# Patient Record
Sex: Male | Born: 1940 | Race: White | Hispanic: No | Marital: Single | Smoking: Current every day smoker
Health system: Southern US, Community
[De-identification: ages and names within clinical notes are randomized; demographics above are authoritative.]

## PROBLEM LIST (undated history)

## (undated) DIAGNOSIS — I2510 Atherosclerotic heart disease of native coronary artery without angina pectoris: Secondary | ICD-10-CM

## (undated) DIAGNOSIS — F909 Attention-deficit hyperactivity disorder, unspecified type: Secondary | ICD-10-CM

## (undated) HISTORY — PX: HERNIA REPAIR: SHX51

## (undated) HISTORY — PX: CARDIAC SURGERY: SHX584

---

## 2007-08-30 ENCOUNTER — Other Ambulatory Visit: Payer: Self-pay

## 2007-08-30 ENCOUNTER — Inpatient Hospital Stay: Payer: Self-pay | Admitting: Internal Medicine

## 2008-01-18 ENCOUNTER — Ambulatory Visit: Payer: Self-pay | Admitting: Internal Medicine

## 2017-09-09 ENCOUNTER — Encounter: Payer: Self-pay | Admitting: Emergency Medicine

## 2017-09-09 ENCOUNTER — Emergency Department: Payer: Medicare Other

## 2017-09-09 ENCOUNTER — Emergency Department
Admission: EM | Admit: 2017-09-09 | Discharge: 2017-09-09 | Disposition: A | Discharge status: Home / Self Care | Payer: Medicare Other | Attending: Emergency Medicine | Admitting: Emergency Medicine

## 2017-09-09 ENCOUNTER — Other Ambulatory Visit: Payer: Self-pay

## 2017-09-09 DIAGNOSIS — J209 Acute bronchitis, unspecified: Secondary | ICD-10-CM | POA: Diagnosis not present

## 2017-09-09 DIAGNOSIS — F17210 Nicotine dependence, cigarettes, uncomplicated: Secondary | ICD-10-CM | POA: Insufficient documentation

## 2017-09-09 DIAGNOSIS — R0602 Shortness of breath: Secondary | ICD-10-CM

## 2017-09-09 DIAGNOSIS — J40 Bronchitis, not specified as acute or chronic: Secondary | ICD-10-CM

## 2017-09-09 DIAGNOSIS — Z79899 Other long term (current) drug therapy: Secondary | ICD-10-CM | POA: Insufficient documentation

## 2017-09-09 DIAGNOSIS — E876 Hypokalemia: Secondary | ICD-10-CM | POA: Diagnosis not present

## 2017-09-09 HISTORY — DX: Attention-deficit hyperactivity disorder, unspecified type: F90.9

## 2017-09-09 LAB — CBC
HEMATOCRIT: 36.6 % — AB (ref 40.0–52.0)
Hemoglobin: 12.1 g/dL — ABNORMAL LOW (ref 13.0–18.0)
MCH: 29.1 pg (ref 26.0–34.0)
MCHC: 33.1 g/dL (ref 32.0–36.0)
MCV: 87.9 fL (ref 80.0–100.0)
Platelets: 169 10*3/uL (ref 150–440)
RBC: 4.16 MIL/uL — AB (ref 4.40–5.90)
RDW: 19.8 % — AB (ref 11.5–14.5)
WBC: 5.2 10*3/uL (ref 3.8–10.6)

## 2017-09-09 LAB — COMPREHENSIVE METABOLIC PANEL
ALBUMIN: 3.2 g/dL — AB (ref 3.5–5.0)
ALK PHOS: 83 U/L (ref 38–126)
ALT: 8 U/L — ABNORMAL LOW (ref 17–63)
AST: 21 U/L (ref 15–41)
Anion gap: 11 (ref 5–15)
BILIRUBIN TOTAL: 0.9 mg/dL (ref 0.3–1.2)
BUN: 16 mg/dL (ref 6–20)
CALCIUM: 8.9 mg/dL (ref 8.9–10.3)
CO2: 21 mmol/L — ABNORMAL LOW (ref 22–32)
Chloride: 104 mmol/L (ref 101–111)
Creatinine, Ser: 1.5 mg/dL — ABNORMAL HIGH (ref 0.61–1.24)
GFR calc Af Amer: 51 mL/min — ABNORMAL LOW (ref >60)
GFR, EST NON AFRICAN AMERICAN: 44 mL/min — AB (ref >60)
GLUCOSE: 108 mg/dL — AB (ref 65–99)
Potassium: 2.8 mmol/L — ABNORMAL LOW (ref 3.5–5.1)
Sodium: 136 mmol/L (ref 135–145)
TOTAL PROTEIN: 6.8 g/dL (ref 6.5–8.1)

## 2017-09-09 LAB — TROPONIN I
TROPONIN I: 0.03 ng/mL — AB (ref <0.03)
Troponin I: 0.03 ng/mL (ref <0.03)

## 2017-09-09 MED ORDER — PREDNISONE 20 MG PO TABS: 60.0000 mg | ORAL_TABLET | Freq: Every day | ORAL | 0 refills | Status: AC

## 2017-09-09 MED ORDER — POTASSIUM CHLORIDE 10 MEQ/100ML IV SOLN
10.0000 meq | Freq: Once | INTRAVENOUS | Status: AC
  Administered 2017-09-09: 10 meq via INTRAVENOUS
  Filled 2017-09-09: qty 100

## 2017-09-09 MED ORDER — ALBUTEROL SULFATE HFA 108 (90 BASE) MCG/ACT IN AERS: 2.0000 | INHALATION_SPRAY | Freq: Four times a day (QID) | RESPIRATORY_TRACT | 2 refills | Status: AC | PRN

## 2017-09-09 MED ORDER — SODIUM CHLORIDE 0.9 % IV BOLUS (SEPSIS)
1000.0000 mL | Freq: Once | INTRAVENOUS | Status: AC
  Administered 2017-09-09: 1000 mL via INTRAVENOUS

## 2017-09-09 MED ORDER — POTASSIUM CHLORIDE CRYS ER 20 MEQ PO TBCR
20.0000 meq | EXTENDED_RELEASE_TABLET | Freq: Once | ORAL | Status: AC
  Administered 2017-09-09: 20 meq via ORAL
  Filled 2017-09-09: qty 1

## 2017-09-09 MED ORDER — AZITHROMYCIN 250 MG PO TABS: ORAL_TABLET | ORAL | 0 refills | Status: AC

## 2017-09-09 MED ORDER — PREDNISONE 20 MG PO TABS
60.0000 mg | ORAL_TABLET | Freq: Once | ORAL | Status: AC
  Administered 2017-09-09: 60 mg via ORAL
  Filled 2017-09-09: qty 3

## 2017-09-09 MED ORDER — POTASSIUM CHLORIDE ER 20 MEQ PO TBCR: 20.0000 meq | EXTENDED_RELEASE_TABLET | Freq: Two times a day (BID) | ORAL | 0 refills | Status: AC

## 2017-09-09 MED ORDER — ALBUTEROL SULFATE (2.5 MG/3ML) 0.083% IN NEBU
2.5000 mg | INHALATION_SOLUTION | Freq: Once | RESPIRATORY_TRACT | Status: AC
  Administered 2017-09-09: 2.5 mg via RESPIRATORY_TRACT
  Filled 2017-09-09: qty 3

## 2017-09-09 NOTE — Discharge Instructions (Signed)
Take the potassium, the prednisone and the antibiotic as prescribed and finish the full course.  You may use the albuterol as needed.  Return to the ER immediately for new or worsening difficulty breathing, lightheadedness or weakness, chest pain, palpitations, fevers, or any other new or worsening symptoms that concern you.  You should make an appointment to follow-up with your primary care doctor as soon as possible.  You will likely need additional outpatient workup to further evaluate the cause of your shortness of breath, and to evaluate the weight loss.

## 2017-09-09 NOTE — ED Notes (Signed)
Date and time results received: 09/09/17 1744 (use smartphrase ".now" to insert current time)  Test: trop I Critical Value: 0.03  Name of Provider Notified: Lord  Orders Received? Or Actions Taken?: No ne orders at this time

## 2017-09-09 NOTE — ED Triage Notes (Signed)
States had been SOB x 2 months. States began coughing up "strawberry red" phlegm approx 1 month ago. States has lost about 40 pounds in past month and half.

## 2017-09-09 NOTE — ED Provider Notes (Addendum)
[facility] Emergency Department Provider Note ____________________________________________   First MD Initiated Contact with Patient 09/09/17 1739     (approximate)  I have reviewed the triage vital signs and the nursing notes.   HISTORY  Chief Complaint Shortness of Breath and Weight Loss    HPI Jimmy Hopkins is a 76 y.o. male with a past medical history of ADHD who presents with shortness of breath over the last 2 months, gradual onset, worsening course, worse with exertion, associated with cough that is sometimes productive of blood-tinged sputum, and associated with an approximately 40-50 pound unintentional weight loss over the last 2 months.  Patient denies any acute fever, he denies chest pain, leg swelling, lightheadedness, generalized weakness, or vomiting.  He does report chronic diarrhea over the last several weeks, with loose stools usually daily but sometimes more frequently.  Patient states that the reason he came in today is that he was joining his family for Thanksgiving lunch, and they became concerned and told him he needed to come to the hospital.  Past Medical History:  Diagnosis Date  . ADHD     There are no active problems to display for this patient.   History reviewed. No pertinent surgical history.  Prior to Admission medications   Medication Sig Start Date End Date Taking? Authorizing Provider  methylphenidate (RITALIN) 10 MG tablet Take 1 tablet by mouth 2 (two) times daily.   Yes [provider]  methylphenidate (RITALIN) 20 MG tablet Take 1 tablet by mouth 3 (three) times daily. 06/16/17  Yes [provider]    Allergies Tetracyclines & related  No family history on file.  Social History Social History   Tobacco Use  . Smoking status: Current Every Day Smoker    Packs/day: 1.00    Types: Cigarettes  Substance Use Topics  . Alcohol use: Not on file  . Drug use: Not on file    Review of  Systems  Constitutional: No fever/chills Eyes: No redness. ENT: No sore throat. Cardiovascular: Denies chest pain. Respiratory: Positive for shortness of breath. Gastrointestinal: No nausea, no vomiting.  Positive for diarrhea.  Genitourinary: Negative for dysuria.  Musculoskeletal: Negative for back pain. Skin: Negative for rash. Neurological: Negative for headaches, focal weakness or numbness.   ____________________________________________   PHYSICAL EXAM:  VITAL SIGNS: ED Triage Vitals  Enc Vitals Group     BP 09/09/17 1648 (!) 131/93     Pulse Rate 09/09/17 1648 (!) 101     Resp 09/09/17 1648 20     Temp 09/09/17 1648 97.6 F (36.4 C)     Temp Source 09/09/17 1648 Oral     SpO2 09/09/17 1648 100 %     Weight 09/09/17 1649 148 lb 2.4 oz (67.2 kg)     Height 09/09/17 1649 6' (1.829 m)     Head Circumference --      Peak Flow --      Pain Score --      Pain Loc --      Pain Edu? --      Excl. in GC? --     Constitutional: Alert and oriented.  Slightly uncomfortable appearing but in no acute distress. Eyes: Conjunctivae are normal.  Head: Atraumatic. Nose: No congestion/rhinnorhea. Mouth/Throat: Mucous membranes are dry.   Neck: Normal range of motion.  Cardiovascular: Normal rate, regular rhythm. Grossly normal heart sounds.  Good peripheral circulation. Respiratory: Normal respiratory effort.  No retractions.  Trace rales to bilateral bases,  but lungs otherwise clear. Gastrointestinal: Soft and nontender. No distention.  Genitourinary: No CVA tenderness. Musculoskeletal: No lower extremity edema.  Extremities warm and well perfused.  Neurologic:  Normal speech and language. No gross focal neurologic deficits are appreciated.  Skin:  Skin is warm and dry. No rash noted. Psychiatric: Mood and affect are normal. Speech and behavior are normal.  ____________________________________________   LABS (all labs ordered are listed, but only abnormal results are  displayed)  Labs Reviewed  CBC - Abnormal; Notable for the following components:      Result Value   RBC 4.16 (*)    Hemoglobin 12.1 (*)    HCT 36.6 (*)    RDW 19.8 (*)    All other components within normal limits  TROPONIN I - Abnormal; Notable for the following components:   Troponin I 0.03 (*)    All other components within normal limits  COMPREHENSIVE METABOLIC PANEL - Abnormal; Notable for the following components:   Potassium 2.8 (*)    CO2 21 (*)    Glucose, Bld 108 (*)    Creatinine, Ser 1.50 (*)    Albumin 3.2 (*)    ALT 8 (*)    GFR calc non Af Amer 44 (*)    GFR calc Af Amer 51 (*)    All other components within normal limits  TROPONIN I - Abnormal; Notable for the following components:   Troponin I 0.03 (*)    All other components within normal limits   ____________________________________________  EKG  ED ECG REPORT I, Dionne BucySebastian Major Santerre, the attending physician, personally viewed and interpreted this ECG.  Date: 09/09/2017 EKG Time: 1700 Rate: 101 Rhythm: normal sinus rhythm with occasional PVCs QRS Axis: normal Intervals: normal ST/T Wave abnormalities: Nonspecific T wave flattening laterally Narrative Interpretation: Nonspecific findings, but no evidence of acute ischemia; no recent prior EKG for comparison.  ____________________________________________  RADIOLOGY  CXR: L lower lobe opacity consistent with pna, bilateral effusions  CT chest: Bronchial thickening and bronchiectasis.  Bilateral pleural effusions.  Thoracic aortic aneurysm.  No focal infiltrate.  ____________________________________________   PROCEDURES  Procedure(s) performed: No    Critical Care performed: No ____________________________________________   INITIAL IMPRESSION / ASSESSMENT AND PLAN / ED COURSE  Pertinent labs & imaging results that were available during my care of the patient were reviewed by me and considered in my medical decision making (see chart for  details).  76 year old male with past medical history as noted above presents with worsening shortness of breath over the last 2 months, somewhat exertional, and associated with intermittently blood-tinged sputum.  Is also associated with an unintentional weight loss.  Review of past medical records in Epic is noncontributory.  Patient has not seen a doctor since the symptoms began.  On exam, patient is slightly uncomfortable but not acutely toxic appearing, his vital signs are normal except for borderline heart rate, his O2 sat is normal, and the remainder of the exam is significant for trace bilateral rales to the bases but otherwise no significant findings.  I do not have baseline labs on the patient, however initial workup revealed slight anemia, hypokalemia, slightly elevated creatinine, and minimally elevated indeterminate troponin.  Chest x-ray is consistent with a left lower lobe pneumonia.  The symptoms could very well be from a pneumonia versus bronchitis, however due to the chronic nature of the symptoms, the weight loss, and the blood-tinged sputum, my differential also includes malignancy.  I will obtain a CT chest to evaluate for mass  or other signs of malignancy.  If negative we will treat for pneumonia and have patient follow-up closely with his primary care.   ----------------------------------------- 7:20 PM on 09/09/2017 -----------------------------------------  CT confirms bilateral pleural effusion, but does not demonstrate any focal infiltrate, and there is no evidence of malignancy.  Patient has evidence of ocular thickening and bronchiectasis.  Patient states he is a smoker, and I suspect that this could be new onset COPD.  The CT also reveals thoracic aortic aneurysm, however patient states that he knew about this.  He has no chest pain, severe hypertension, or other evidence of dissection, rupture, or other acute complication.  We will await a repeat troponin, and give  nebulizer and steroid.  Given that patient has no hypoxia or respiratory distress, and the symptoms are chronic, there is no indication for admission to the hospital at this time.  We will treat for likely COPD with steroid and albuterol, and patient will be instructed to follow-up with his primary care doctor.  I will also discharge him with oral potassium, as he was found to be hypokalemic.  No EKG changes related to hypokalemia or indication for prolonged IV K therapy or admission.  Will give an IV dose here.   ----------------------------------------- 9:31 PM on 09/09/2017 -----------------------------------------  Patient states that symptoms have improved after the albuterol and prednisone.  Repeat troponin is unchanged.  He continues to have no chest pain.  He feels comfortable to go home.  I counseled patient and his family members extensively on the results of the workup, including the pulmonary findings, the hypokalemia, and the need for additional outpatient workup to determine the etiology of his respiratory problems, as well as to workup the weight loss.  I will discharge with prescription for albuterol and prednisone, azithromycin, as well as potassium repletion.  Return precautions given, and patient and family members expressed understanding.  He agrees to follow-up with his doctor within the next week.  ____________________________________________   FINAL CLINICAL IMPRESSION(S) / ED DIAGNOSES  Final diagnoses:  Shortness of breath  Bronchitis  Hypokalemia      NEW MEDICATIONS STARTED DURING THIS VISIT:  This SmartLink is deprecated. Use AVSMEDLIST instead to display the medication list for a patient.   Note:  This document was prepared using Dragon voice recognition software and may include unintentional dictation errors.    Dionne BucySiadecki, Izzah Pasqua, MD 09/09/17 2139    Dionne BucySiadecki, Braidan Ricciardi, MD 09/09/17 [phone]

## 2017-09-15 ENCOUNTER — Inpatient Hospital Stay (HOSPITAL_COMMUNITY)
Admit: 2017-09-15 | Discharge: 2017-09-15 | Disposition: A | Discharge status: Home / Self Care | Payer: Medicare Other | Attending: Internal Medicine | Admitting: Internal Medicine

## 2017-09-15 ENCOUNTER — Emergency Department: Payer: Medicare Other

## 2017-09-15 ENCOUNTER — Other Ambulatory Visit: Payer: Self-pay

## 2017-09-15 ENCOUNTER — Inpatient Hospital Stay
Admission: EM | Admit: 2017-09-15 | Discharge: 2017-10-19 | DRG: 871 | Disposition: E | Payer: Medicare Other | Attending: Specialist | Admitting: Specialist

## 2017-09-15 ENCOUNTER — Encounter: Payer: Self-pay | Admitting: Emergency Medicine

## 2017-09-15 DIAGNOSIS — I714 Abdominal aortic aneurysm, without rupture, unspecified: Secondary | ICD-10-CM

## 2017-09-15 DIAGNOSIS — I4891 Unspecified atrial fibrillation: Secondary | ICD-10-CM | POA: Diagnosis present

## 2017-09-15 DIAGNOSIS — R1010 Upper abdominal pain, unspecified: Secondary | ICD-10-CM | POA: Diagnosis not present

## 2017-09-15 DIAGNOSIS — R7989 Other specified abnormal findings of blood chemistry: Secondary | ICD-10-CM | POA: Diagnosis not present

## 2017-09-15 DIAGNOSIS — I712 Thoracic aortic aneurysm, without rupture, unspecified: Secondary | ICD-10-CM

## 2017-09-15 DIAGNOSIS — E861 Hypovolemia: Secondary | ICD-10-CM | POA: Diagnosis present

## 2017-09-15 DIAGNOSIS — A419 Sepsis, unspecified organism: Secondary | ICD-10-CM | POA: Diagnosis present

## 2017-09-15 DIAGNOSIS — G9341 Metabolic encephalopathy: Secondary | ICD-10-CM | POA: Diagnosis present

## 2017-09-15 DIAGNOSIS — E875 Hyperkalemia: Secondary | ICD-10-CM | POA: Diagnosis present

## 2017-09-15 DIAGNOSIS — K551 Chronic vascular disorders of intestine: Secondary | ICD-10-CM | POA: Diagnosis not present

## 2017-09-15 DIAGNOSIS — I5023 Acute on chronic systolic (congestive) heart failure: Secondary | ICD-10-CM | POA: Diagnosis present

## 2017-09-15 DIAGNOSIS — R1084 Generalized abdominal pain: Secondary | ICD-10-CM

## 2017-09-15 DIAGNOSIS — I480 Paroxysmal atrial fibrillation: Secondary | ICD-10-CM | POA: Diagnosis present

## 2017-09-15 DIAGNOSIS — N179 Acute kidney failure, unspecified: Secondary | ICD-10-CM

## 2017-09-15 DIAGNOSIS — N184 Chronic kidney disease, stage 4 (severe): Secondary | ICD-10-CM | POA: Diagnosis present

## 2017-09-15 DIAGNOSIS — R109 Unspecified abdominal pain: Secondary | ICD-10-CM

## 2017-09-15 DIAGNOSIS — I361 Nonrheumatic tricuspid (valve) insufficiency: Secondary | ICD-10-CM

## 2017-09-15 DIAGNOSIS — E43 Unspecified severe protein-calorie malnutrition: Secondary | ICD-10-CM | POA: Diagnosis present

## 2017-09-15 DIAGNOSIS — J9602 Acute respiratory failure with hypercapnia: Secondary | ICD-10-CM | POA: Diagnosis not present

## 2017-09-15 DIAGNOSIS — Z8249 Family history of ischemic heart disease and other diseases of the circulatory system: Secondary | ICD-10-CM

## 2017-09-15 DIAGNOSIS — E871 Hypo-osmolality and hyponatremia: Secondary | ICD-10-CM | POA: Diagnosis present

## 2017-09-15 DIAGNOSIS — I739 Peripheral vascular disease, unspecified: Secondary | ICD-10-CM | POA: Diagnosis not present

## 2017-09-15 DIAGNOSIS — K7200 Acute and subacute hepatic failure without coma: Secondary | ICD-10-CM | POA: Diagnosis present

## 2017-09-15 DIAGNOSIS — Z515 Encounter for palliative care: Secondary | ICD-10-CM | POA: Diagnosis not present

## 2017-09-15 DIAGNOSIS — I420 Dilated cardiomyopathy: Secondary | ICD-10-CM | POA: Diagnosis present

## 2017-09-15 DIAGNOSIS — R410 Disorientation, unspecified: Secondary | ICD-10-CM | POA: Diagnosis not present

## 2017-09-15 DIAGNOSIS — Z66 Do not resuscitate: Secondary | ICD-10-CM | POA: Diagnosis not present

## 2017-09-15 DIAGNOSIS — I2510 Atherosclerotic heart disease of native coronary artery without angina pectoris: Secondary | ICD-10-CM | POA: Diagnosis present

## 2017-09-15 DIAGNOSIS — I70213 Atherosclerosis of native arteries of extremities with intermittent claudication, bilateral legs: Secondary | ICD-10-CM | POA: Diagnosis present

## 2017-09-15 DIAGNOSIS — E872 Acidosis: Secondary | ICD-10-CM | POA: Diagnosis present

## 2017-09-15 DIAGNOSIS — I130 Hypertensive heart and chronic kidney disease with heart failure and stage 1 through stage 4 chronic kidney disease, or unspecified chronic kidney disease: Secondary | ICD-10-CM | POA: Diagnosis present

## 2017-09-15 DIAGNOSIS — D696 Thrombocytopenia, unspecified: Secondary | ICD-10-CM | POA: Diagnosis present

## 2017-09-15 DIAGNOSIS — J9601 Acute respiratory failure with hypoxia: Secondary | ICD-10-CM | POA: Diagnosis present

## 2017-09-15 DIAGNOSIS — E860 Dehydration: Secondary | ICD-10-CM | POA: Diagnosis present

## 2017-09-15 DIAGNOSIS — R627 Adult failure to thrive: Secondary | ICD-10-CM | POA: Diagnosis present

## 2017-09-15 DIAGNOSIS — Z6820 Body mass index (BMI) 20.0-20.9, adult: Secondary | ICD-10-CM

## 2017-09-15 DIAGNOSIS — N189 Chronic kidney disease, unspecified: Secondary | ICD-10-CM | POA: Diagnosis not present

## 2017-09-15 DIAGNOSIS — J9690 Respiratory failure, unspecified, unspecified whether with hypoxia or hypercapnia: Secondary | ICD-10-CM

## 2017-09-15 DIAGNOSIS — N170 Acute kidney failure with tubular necrosis: Secondary | ICD-10-CM | POA: Diagnosis present

## 2017-09-15 DIAGNOSIS — I248 Other forms of acute ischemic heart disease: Secondary | ICD-10-CM | POA: Diagnosis present

## 2017-09-15 DIAGNOSIS — F909 Attention-deficit hyperactivity disorder, unspecified type: Secondary | ICD-10-CM | POA: Diagnosis present

## 2017-09-15 DIAGNOSIS — D649 Anemia, unspecified: Secondary | ICD-10-CM | POA: Diagnosis present

## 2017-09-15 DIAGNOSIS — G8929 Other chronic pain: Secondary | ICD-10-CM | POA: Diagnosis present

## 2017-09-15 DIAGNOSIS — R6521 Severe sepsis with septic shock: Secondary | ICD-10-CM | POA: Diagnosis present

## 2017-09-15 DIAGNOSIS — F17210 Nicotine dependence, cigarettes, uncomplicated: Secondary | ICD-10-CM | POA: Diagnosis present

## 2017-09-15 DIAGNOSIS — Z951 Presence of aortocoronary bypass graft: Secondary | ICD-10-CM | POA: Diagnosis not present

## 2017-09-15 HISTORY — DX: Atherosclerotic heart disease of native coronary artery without angina pectoris: I25.10

## 2017-09-15 LAB — COMPREHENSIVE METABOLIC PANEL
ALBUMIN: 3.6 g/dL (ref 3.5–5.0)
ALBUMIN: 3.1 g/dL — AB (ref 3.5–5.0)
ALT: 1777 U/L — ABNORMAL HIGH (ref 17–63)
ALT: 1454 U/L — ABNORMAL HIGH (ref 17–63)
ANION GAP: 13 (ref 5–15)
AST: 2085 U/L — ABNORMAL HIGH (ref 15–41)
AST: 1460 U/L — ABNORMAL HIGH (ref 15–41)
Alkaline Phosphatase: 140 U/L — ABNORMAL HIGH (ref 38–126)
Alkaline Phosphatase: 107 U/L (ref 38–126)
Anion gap: 14 (ref 5–15)
BILIRUBIN TOTAL: 3.4 mg/dL — AB (ref 0.3–1.2)
BILIRUBIN TOTAL: 3 mg/dL — AB (ref 0.3–1.2)
BUN: 62 mg/dL — ABNORMAL HIGH (ref 6–20)
BUN: 56 mg/dL — ABNORMAL HIGH (ref 6–20)
CHLORIDE: 99 mmol/L — AB (ref 101–111)
CO2: 16 mmol/L — AB (ref 22–32)
CO2: 16 mmol/L — ABNORMAL LOW (ref 22–32)
Calcium: 9.3 mg/dL (ref 8.9–10.3)
Calcium: 8.4 mg/dL — ABNORMAL LOW (ref 8.9–10.3)
Chloride: 101 mmol/L (ref 101–111)
Creatinine, Ser: 2.89 mg/dL — ABNORMAL HIGH (ref 0.61–1.24)
Creatinine, Ser: 2.66 mg/dL — ABNORMAL HIGH (ref 0.61–1.24)
GFR calc Af Amer: 23 mL/min — ABNORMAL LOW (ref >60)
GFR calc non Af Amer: 20 mL/min — ABNORMAL LOW (ref >60)
GFR calc non Af Amer: 22 mL/min — ABNORMAL LOW (ref >60)
GFR, EST AFRICAN AMERICAN: 25 mL/min — AB (ref >60)
GLUCOSE: 58 mg/dL — AB (ref 65–99)
GLUCOSE: 94 mg/dL (ref 65–99)
POTASSIUM: 6.9 mmol/L — AB (ref 3.5–5.1)
POTASSIUM: 5.5 mmol/L — AB (ref 3.5–5.1)
SODIUM: 128 mmol/L — AB (ref 135–145)
Sodium: 131 mmol/L — ABNORMAL LOW (ref 135–145)
TOTAL PROTEIN: 6.5 g/dL (ref 6.5–8.1)
TOTAL PROTEIN: 5.5 g/dL — AB (ref 6.5–8.1)

## 2017-09-15 LAB — BRAIN NATRIURETIC PEPTIDE

## 2017-09-15 LAB — BASIC METABOLIC PANEL
ANION GAP: 9 (ref 5–15)
Anion gap: 11 (ref 5–15)
BUN: 60 mg/dL — AB (ref 6–20)
BUN: 57 mg/dL — ABNORMAL HIGH (ref 6–20)
CALCIUM: 8.3 mg/dL — AB (ref 8.9–10.3)
CALCIUM: 8.1 mg/dL — AB (ref 8.9–10.3)
CO2: 18 mmol/L — AB (ref 22–32)
CO2: 20 mmol/L — AB (ref 22–32)
CREATININE: 2.81 mg/dL — AB (ref 0.61–1.24)
Chloride: 100 mmol/L — ABNORMAL LOW (ref 101–111)
Chloride: 101 mmol/L (ref 101–111)
Creatinine, Ser: 2.69 mg/dL — ABNORMAL HIGH (ref 0.61–1.24)
GFR calc Af Amer: 25 mL/min — ABNORMAL LOW (ref >60)
GFR calc non Af Amer: 21 mL/min — ABNORMAL LOW (ref >60)
GFR, EST AFRICAN AMERICAN: 24 mL/min — AB (ref >60)
GFR, EST NON AFRICAN AMERICAN: 22 mL/min — AB (ref >60)
Glucose, Bld: 108 mg/dL — ABNORMAL HIGH (ref 65–99)
Glucose, Bld: 118 mg/dL — ABNORMAL HIGH (ref 65–99)
Potassium: 5.3 mmol/L — ABNORMAL HIGH (ref 3.5–5.1)
Potassium: 4.9 mmol/L (ref 3.5–5.1)
SODIUM: 129 mmol/L — AB (ref 135–145)
Sodium: 130 mmol/L — ABNORMAL LOW (ref 135–145)

## 2017-09-15 LAB — URINALYSIS, ROUTINE W REFLEX MICROSCOPIC
BILIRUBIN URINE: NEGATIVE
Bacteria, UA: NONE SEEN
Glucose, UA: NEGATIVE mg/dL
Ketones, ur: NEGATIVE mg/dL
Leukocytes, UA: NEGATIVE
Nitrite: NEGATIVE
PH: 5 (ref 5.0–8.0)
Protein, ur: NEGATIVE mg/dL
SPECIFIC GRAVITY, URINE: 1.01 (ref 1.005–1.030)
SQUAMOUS EPITHELIAL / LPF: NONE SEEN

## 2017-09-15 LAB — URINALYSIS, COMPLETE (UACMP) WITH MICROSCOPIC
BACTERIA UA: NONE SEEN
Bilirubin Urine: NEGATIVE
GLUCOSE, UA: NEGATIVE mg/dL
HGB URINE DIPSTICK: NEGATIVE
KETONES UR: NEGATIVE mg/dL
LEUKOCYTES UA: NEGATIVE
NITRITE: NEGATIVE
PROTEIN: NEGATIVE mg/dL
Specific Gravity, Urine: 1.014 (ref 1.005–1.030)
pH: 5 (ref 5.0–8.0)

## 2017-09-15 LAB — CBC
HEMATOCRIT: 34.7 % — AB (ref 40.0–52.0)
HEMOGLOBIN: 11.1 g/dL — AB (ref 13.0–18.0)
MCH: 28.6 pg (ref 26.0–34.0)
MCHC: 32.1 g/dL (ref 32.0–36.0)
MCV: 89.2 fL (ref 80.0–100.0)
Platelets: 65 10*3/uL — ABNORMAL LOW (ref 150–440)
RBC: 3.89 MIL/uL — ABNORMAL LOW (ref 4.40–5.90)
RDW: 19.6 % — ABNORMAL HIGH (ref 11.5–14.5)
WBC: 11.6 10*3/uL — ABNORMAL HIGH (ref 3.8–10.6)

## 2017-09-15 LAB — CORTISOL: CORTISOL PLASMA: 36.1 ug/dL

## 2017-09-15 LAB — TROPONIN I
TROPONIN I: 0.23 ng/mL — AB (ref <0.03)
Troponin I: 0.24 ng/mL (ref <0.03)

## 2017-09-15 LAB — ECHOCARDIOGRAM COMPLETE
Height: 72 in
Weight: 2400 oz

## 2017-09-15 LAB — TYPE AND SCREEN
ABO/RH(D): A POS
Antibody Screen: NEGATIVE

## 2017-09-15 LAB — GLUCOSE, CAPILLARY
GLUCOSE-CAPILLARY: 104 mg/dL — AB (ref 65–99)
GLUCOSE-CAPILLARY: 120 mg/dL — AB (ref 65–99)
GLUCOSE-CAPILLARY: 121 mg/dL — AB (ref 65–99)
Glucose-Capillary: 47 mg/dL — ABNORMAL LOW (ref 65–99)
Glucose-Capillary: 56 mg/dL — ABNORMAL LOW (ref 65–99)

## 2017-09-15 LAB — PROCALCITONIN: PROCALCITONIN: 1.04 ng/mL

## 2017-09-15 LAB — MRSA PCR SCREENING: MRSA BY PCR: NEGATIVE

## 2017-09-15 LAB — MAGNESIUM: Magnesium: 2.3 mg/dL (ref 1.7–2.4)

## 2017-09-15 LAB — PROTIME-INR
INR: 3.49
PROTHROMBIN TIME: 34.8 s — AB (ref 11.4–15.2)

## 2017-09-15 LAB — APTT: aPTT: 42 seconds — ABNORMAL HIGH (ref 24–36)

## 2017-09-15 LAB — LACTIC ACID, PLASMA
LACTIC ACID, VENOUS: 3.6 mmol/L — AB (ref 0.5–1.9)
LACTIC ACID, VENOUS: 3.2 mmol/L — AB (ref 0.5–1.9)
Lactic Acid, Venous: 5.5 mmol/L (ref 0.5–1.9)

## 2017-09-15 LAB — LIPASE, BLOOD: LIPASE: 52 U/L — AB (ref 11–51)

## 2017-09-15 MED ORDER — FUROSEMIDE 10 MG/ML IJ SOLN
40.0000 mg | Freq: Once | INTRAMUSCULAR | Status: AC
  Administered 2017-09-15: 40 mg via INTRAVENOUS
  Filled 2017-09-15: qty 4

## 2017-09-15 MED ORDER — HEPARIN SODIUM (PORCINE) 5000 UNIT/ML IJ SOLN: 5000.0000 [IU] | Freq: Three times a day (TID) | INTRAMUSCULAR | Status: DC

## 2017-09-15 MED ORDER — SODIUM CHLORIDE 0.9 % IV SOLN
1.0000 g | Freq: Once | INTRAVENOUS | Status: AC
  Administered 2017-09-15: 1 g via INTRAVENOUS
  Filled 2017-09-15: qty 10

## 2017-09-15 MED ORDER — NOREPINEPHRINE BITARTRATE 1 MG/ML IV SOLN
5.0000 ug/min | INTRAVENOUS | Status: DC
  Filled 2017-09-15: qty 4

## 2017-09-15 MED ORDER — PIPERACILLIN-TAZOBACTAM 3.375 G IVPB 30 MIN
3.3750 g | Freq: Once | INTRAVENOUS | Status: DC
  Filled 2017-09-15: qty 50

## 2017-09-15 MED ORDER — SODIUM BICARBONATE 8.4 % IV SOLN
INTRAVENOUS | Status: DC
  Administered 2017-09-15 – 2017-09-16 (×2): via INTRAVENOUS
  Filled 2017-09-15 (×2): qty 150

## 2017-09-15 MED ORDER — DOCUSATE SODIUM 100 MG PO CAPS: 100.0000 mg | ORAL_CAPSULE | Freq: Two times a day (BID) | ORAL | Status: DC

## 2017-09-15 MED ORDER — ONDANSETRON HCL 4 MG PO TABS: 4.0000 mg | ORAL_TABLET | Freq: Four times a day (QID) | ORAL | Status: DC | PRN

## 2017-09-15 MED ORDER — SODIUM CHLORIDE 0.9 % IV BOLUS (SEPSIS): 1000.0000 mL | Freq: Once | INTRAVENOUS | Status: DC

## 2017-09-15 MED ORDER — ACETAMINOPHEN 650 MG RE SUPP: 650.0000 mg | Freq: Four times a day (QID) | RECTAL | Status: DC | PRN

## 2017-09-15 MED ORDER — FENTANYL CITRATE (PF) 100 MCG/2ML IJ SOLN
50.0000 ug | Freq: Once | INTRAMUSCULAR | Status: AC
  Administered 2017-09-15: 50 ug via INTRAVENOUS

## 2017-09-15 MED ORDER — VANCOMYCIN HCL IN DEXTROSE 750-5 MG/150ML-% IV SOLN: 750.0000 mg | INTRAVENOUS | Status: DC

## 2017-09-15 MED ORDER — BISACODYL 5 MG PO TBEC: 5.0000 mg | DELAYED_RELEASE_TABLET | Freq: Every day | ORAL | Status: DC | PRN

## 2017-09-15 MED ORDER — VANCOMYCIN HCL IN DEXTROSE 1-5 GM/200ML-% IV SOLN: 1000.0000 mg | Freq: Once | INTRAVENOUS | Status: DC

## 2017-09-15 MED ORDER — IOPAMIDOL (ISOVUE-370) INJECTION 76%
100.0000 mL | Freq: Once | INTRAVENOUS | Status: AC | PRN
  Administered 2017-09-15: 100 mL via INTRAVENOUS

## 2017-09-15 MED ORDER — DEXMEDETOMIDINE HCL IN NACL 400 MCG/100ML IV SOLN
0.0000 ug/kg/h | INTRAVENOUS | Status: DC
  Administered 2017-09-15: 0.6 ug/kg/h via INTRAVENOUS
  Administered 2017-09-15: 0.8 ug/kg/h via INTRAVENOUS
  Administered 2017-09-16: 0.6 ug/kg/h via INTRAVENOUS
  Filled 2017-09-15 (×3): qty 100

## 2017-09-15 MED ORDER — MORPHINE SULFATE (PF) 2 MG/ML IV SOLN
INTRAVENOUS | Status: AC
  Administered 2017-09-15: 2 mg via INTRAVENOUS
  Filled 2017-09-15: qty 1

## 2017-09-15 MED ORDER — ACETAMINOPHEN 325 MG PO TABS: 650.0000 mg | ORAL_TABLET | Freq: Four times a day (QID) | ORAL | Status: DC | PRN

## 2017-09-15 MED ORDER — PIPERACILLIN-TAZOBACTAM 3.375 G IVPB
3.3750 g | Freq: Two times a day (BID) | INTRAVENOUS | Status: DC
  Administered 2017-09-15 – 2017-09-16 (×2): 3.375 g via INTRAVENOUS
  Filled 2017-09-15 (×2): qty 50

## 2017-09-15 MED ORDER — SODIUM CHLORIDE 0.9 % IV SOLN
Freq: Once | INTRAVENOUS | Status: AC
  Administered 2017-09-15: 09:00:00 via INTRAVENOUS

## 2017-09-15 MED ORDER — FENTANYL CITRATE (PF) 100 MCG/2ML IJ SOLN
INTRAMUSCULAR | Status: AC
  Administered 2017-09-15: 50 ug via INTRAVENOUS
  Filled 2017-09-15: qty 2

## 2017-09-15 MED ORDER — MAGNESIUM SULFATE 2 GM/50ML IV SOLN
INTRAVENOUS | Status: AC
  Administered 2017-09-15: 2 g via INTRAVENOUS
  Filled 2017-09-15: qty 50

## 2017-09-15 MED ORDER — DEXTROSE 50 % IV SOLN
1.0000 | Freq: Once | INTRAVENOUS | Status: AC
Start: 2017-09-15 — End: 2017-09-15
  Administered 2017-09-15: 50 mL via INTRAVENOUS
  Filled 2017-09-15: qty 50

## 2017-09-15 MED ORDER — FENTANYL CITRATE (PF) 100 MCG/2ML IJ SOLN
12.5000 ug | INTRAMUSCULAR | Status: DC | PRN
  Administered 2017-09-16 – 2017-09-17 (×3): 25 ug via INTRAVENOUS
  Filled 2017-09-15 (×3): qty 2

## 2017-09-15 MED ORDER — PIPERACILLIN-TAZOBACTAM 3.375 G IVPB: 3.3750 g | Freq: Two times a day (BID) | INTRAVENOUS | Status: DC

## 2017-09-15 MED ORDER — ONDANSETRON HCL 4 MG/2ML IJ SOLN: 4.0000 mg | Freq: Four times a day (QID) | INTRAMUSCULAR | Status: DC | PRN

## 2017-09-15 MED ORDER — CALCIUM GLUCONATE 10 % IV SOLN
INTRAVENOUS | Status: AC
  Filled 2017-09-15: qty 10

## 2017-09-15 MED ORDER — PIPERACILLIN-TAZOBACTAM 3.375 G IVPB: 3.3750 g | Freq: Three times a day (TID) | INTRAVENOUS | Status: DC

## 2017-09-15 MED ORDER — MAGNESIUM SULFATE 2 GM/50ML IV SOLN
2.0000 g | Freq: Once | INTRAVENOUS | Status: AC
  Administered 2017-09-15: 2 g via INTRAVENOUS

## 2017-09-15 MED ORDER — SODIUM CHLORIDE 0.9 % IV BOLUS (SEPSIS): 250.0000 mL | Freq: Once | INTRAVENOUS | Status: DC

## 2017-09-15 MED ORDER — MORPHINE SULFATE (PF) 2 MG/ML IV SOLN
1.0000 mg | INTRAVENOUS | Status: DC | PRN
  Administered 2017-09-15: 2 mg via INTRAVENOUS

## 2017-09-15 MED ORDER — VANCOMYCIN HCL IN DEXTROSE 1-5 GM/200ML-% IV SOLN
1000.0000 mg | Freq: Once | INTRAVENOUS | Status: AC
  Administered 2017-09-15: 1000 mg via INTRAVENOUS
  Filled 2017-09-15: qty 200

## 2017-09-15 MED ORDER — ALBUTEROL SULFATE (2.5 MG/3ML) 0.083% IN NEBU
2.5000 mg | INHALATION_SOLUTION | Freq: Once | RESPIRATORY_TRACT | Status: AC
  Administered 2017-09-15: 2.5 mg via RESPIRATORY_TRACT
  Filled 2017-09-15: qty 3

## 2017-09-15 MED ORDER — SODIUM BICARBONATE 8.4 % IV SOLN
50.0000 meq | Freq: Once | INTRAVENOUS | Status: AC
  Administered 2017-09-15: 50 meq via INTRAVENOUS
  Filled 2017-09-15: qty 50

## 2017-09-15 MED ORDER — DEXTROSE 50 % IV SOLN
INTRAVENOUS | Status: AC
  Administered 2017-09-15: 50 mL via INTRAVENOUS
  Filled 2017-09-15: qty 50

## 2017-09-15 MED ORDER — FUROSEMIDE 10 MG/ML IJ SOLN
INTRAMUSCULAR | Status: AC
  Filled 2017-09-15: qty 4

## 2017-09-15 MED ORDER — INSULIN ASPART 100 UNIT/ML ~~LOC~~ SOLN
10.0000 [IU] | Freq: Once | SUBCUTANEOUS | Status: AC
  Administered 2017-09-15: 10 [IU] via INTRAVENOUS
  Filled 2017-09-15: qty 1

## 2017-09-15 MED ORDER — DEXTROSE 50 % IV SOLN
1.0000 | Freq: Once | INTRAVENOUS | Status: AC
  Administered 2017-09-15: 50 mL via INTRAVENOUS

## 2017-09-15 MED ORDER — FENTANYL CITRATE (PF) 100 MCG/2ML IJ SOLN
25.0000 ug | INTRAMUSCULAR | Status: AC
  Administered 2017-09-15: 25 ug via INTRAVENOUS
  Filled 2017-09-15: qty 2

## 2017-09-15 MED ORDER — PIPERACILLIN-TAZOBACTAM 3.375 G IVPB 30 MIN
3.3750 g | Freq: Once | INTRAVENOUS | Status: AC
  Administered 2017-09-15: 3.375 g via INTRAVENOUS
  Filled 2017-09-15: qty 50

## 2017-09-15 MED ORDER — FUROSEMIDE 10 MG/ML IJ SOLN
80.0000 mg | INTRAMUSCULAR | Status: AC
  Administered 2017-09-15: 80 mg via INTRAVENOUS
  Filled 2017-09-15: qty 8

## 2017-09-15 NOTE — Progress Notes (Addendum)
Pharmacy Antibiotic Note  Jimmy Hopkins is a 76 y.o. male admitted on 08/20/2017 with sepsis of unknown source. Pharmacy has been consulted for Vancomycin and Zosyn dosing.  Patient received one dose of vancomycin 100mg  and Zosyn in the ED.   Plan: Due to potential for worsening SCr, will initiate  Zosyn 3.375g Q12H instead of Q8H.   Will hold off on ordering any scheduled vancomycin doses due to concern for accumulation as patient's Scr is increased. Patient has baseline Scr of 1.5 (11/22) and today AM Scr of 2.89. Additionally, patient received one dose of contrast 11/28 AM (after labs were drawn). Will draw a random 24 hour vancomycin level 11/29 AM to dose off of this level and patient's Scr. Pharmacy will continue to follow and adjust as needed.     ke = 0.022 T1/2 = 31 hr Vd = 47.6   Height: 6' (182.9 cm) Weight: 150 lb (68 kg) IBW/kg (Calculated) : 77.6  Temp (24hrs), Avg:97.5 F (36.4 C), Min:97.5 F (36.4 C), Max:97.5 F (36.4 C)  Recent Labs  Lab 09/09/17 1654 08/20/2017 0555 09/01/2017 0604  WBC 5.2 11.6*  --   CREATININE 1.50* 2.89*  --   LATICACIDVEN  --   --  5.5*    Estimated Creatinine Clearance: 21.2 mL/min (A) (by C-G formula based on SCr of 2.89 mg/dL (H)).    Allergies  Allergen Reactions  . Tetracyclines & Related Hives    Antimicrobials this admission: Vancomycin 11/28 >>  Zosyn 11/28 >>   Dose adjustments this admission:   Microbiology results: BCx: 11/28 >> sent  Thank you for allowing pharmacy to be a part of this patient's care.  Yolanda BonineHannah Rhaelyn Hopkins, PharmD Pharmacy Resident 08/29/2017 10:00 AM

## 2017-09-15 NOTE — Consult Note (Signed)
[facility] Vascular Consult Note  MRN : [id number]  Jimmy CoronaHarry L Hopkins is a 76 y.o. (Mar 06, 1941) male who presents with chief complaint of  Chief Complaint  Patient presents with  . Abdominal Pain  .  History of Present Illness: I am asked to evaluate the patient by Dr. Zenda AlpersWebster.  The patient is a 76 year old gentleman who apparently is quite nave to medical care and presented earlier this morning to the [facility] emergency department with the complaint of abdominal pain.  CT scan was obtained as the patient had a pulsatile mass on abdominal exam.  CT angios did confirm abdominal aortic aneurysm but it is only 4.5 cm in diameter and does not have any indication of rupture.  CT also noted significant femoral occlusive disease with extensive collaterals suggesting a very chronic nature.  While being evaluated in the emergency room he began complaining of his toes hurting and on reevaluation they appear to be somewhat dusky in nature.  This prompted a request for vascular evaluation.  Of note, the patient has multiple other findings both on his laboratory values as well as the CT scan all of which suggest a profound cardiopulmonary hepatic and renal dysfunction.   Current Facility-Administered Medications  Medication Dose Route Frequency Provider Last Rate Last Dose  . calcium gluconate 10 % injection            Current Outpatient Medications  Medication Sig Dispense Refill  . albuterol (PROVENTIL HFA;VENTOLIN HFA) 108 (90 Base) MCG/ACT inhaler Inhale 2 puffs into the lungs every 6 (six) hours as needed for wheezing or shortness of breath. 1 Inhaler 2  . methylphenidate (RITALIN) 10 MG tablet Take 1 tablet by mouth 2 (two) times daily.    . methylphenidate (RITALIN) 20 MG tablet Take 1 tablet by mouth 3 (three) times daily.    . potassium chloride 20 MEQ TBCR Take 20 mEq by mouth 2 (two) times daily for 14 days. (Patient not taking: Reported on  08/19/2017) 28 tablet 0  . predniSONE (DELTASONE) 20 MG tablet Take 3 tablets (60 mg total) by mouth daily for 5 days. (Patient not taking: Reported on 09/14/2017) 15 tablet 0    Past Medical History:  Diagnosis Date  . ADHD     Past Surgical History:  Procedure Laterality Date  . CARDIAC SURGERY    . HERNIA REPAIR      Social History Social History   Tobacco Use  . Smoking status: Current Every Day Smoker    Packs/day: 1.00    Types: Cigarettes  Substance Use Topics  . Alcohol use: Not on file  . Drug use: Not on file    Family History No family history on file.  Per the family members present no family history of bleeding/clotting disorders, porphyria or autoimmune disease   Allergies  Allergen Reactions  . Tetracyclines & Related Hives     REVIEW OF SYSTEMS (Negative unless checked)  Constitutional: [] Weight loss  [] Fever  [] Chills Cardiac: [] Chest pain   [] Chest pressure   [] Palpitations   [] Shortness of breath when laying flat   [] Shortness of breath at rest   [] Shortness of breath with exertion. Vascular:  [] Pain in legs with walking   [] Pain in legs at rest   [] Pain in legs when laying flat   [] Claudication   [] Pain in feet when walking  [] Pain in feet at rest  [] Pain in feet when laying flat   [] History of DVT   [] Phlebitis   []   Swelling in legs   [] Varicose veins   [] Non-healing ulcers Pulmonary:   [] Uses home oxygen   [] Productive cough   [] Hemoptysis   [] Wheeze  [] COPD   [] Asthma Neurologic:  [] Dizziness  [] Blackouts   [] Seizures   [] History of stroke   [] History of TIA  [] Aphasia   [] Temporary blindness   [] Dysphagia   [] Weakness or numbness in arms   [] Weakness or numbness in legs Musculoskeletal:  [] Arthritis   [] Joint swelling   [] Joint pain   [] Low back pain Hematologic:  [] Easy bruising  [] Easy bleeding   [] Hypercoagulable state   [] Anemic  [] Hepatitis Gastrointestinal:  [] Blood in stool   [] Vomiting blood  [] Gastroesophageal reflux/heartburn    [] Difficulty swallowing. Genitourinary:  [] Chronic kidney disease   [] Difficult urination  [] Frequent urination  [] Burning with urination   [] Blood in urine Skin:  [] Rashes   [] Ulcers   [] Wounds Psychological:  [] History of anxiety   []  History of major depression.  Physical Examination  Vitals:   2017-10-09 0745 10-09-2017 0754 2017-10-09 0809 2017-10-09 0833  BP: 138/87 138/78 (!) 130/92 120/86  Pulse: 78 87 84 91  Resp: (!) 24 15 (!) 25 15  Temp:      TempSrc:      SpO2: 99% 100% 100% 100%  Weight:      Height:       Body mass index is 20.34 kg/m. Gen:  WD/WN, confused and combative moderate to severe distress Head: [place]/AT, No temporalis wasting. Prominent temp pulse not noted. Ear/Nose/Throat: Hearing grossly intact, nares w/o erythema or drainage, oropharynx w/o Erythema/Exudate Eyes: Sclera non-icteric, conjunctiva clear Neck: Trachea midline.  + JVD.  Pulmonary: Poor air movement, respirations labored, equal bilaterally.  Cardiac: RRR, normal S1, S2. Vascular: Pulsatile abdominal mass non-tender Vessel Right Left  Radial Palpable Palpable  Aorta  enlarged N/A  Femoral  not palpable Palpable  Popliteal  not palpable  not palpable  PT  not palpable  not palpable  DP  not palpable  not palpable  Gastrointestinal: soft, mildly tender/non-distended. No guarding/reflex.  Musculoskeletal: M/S 5/5 throughout.  Extremities without ischemic changes.  No deformity or atrophy. No edema. Neurologic: Sensation grossly intact in extremities.  Symmetrical.  Speech is confused. Motor exam as listed above. Psychiatric: Judgment poor, Mood & affect appropriate for pt's clinical situation. Dermatologic: No rashes or ulcers noted.  No cellulitis or open wounds. Lymph : No Cervical, Axillary, or Inguinal lymphadenopathy.      CBC Lab Results  Component Value Date   WBC 11.6 (H) 10/09/2017   HGB 11.1 (L) 10/09/17   HCT 34.7 (L) October 09, 2017   MCV 89.2 2017-10-09   PLT 65 (L) October 09, 2017     BMET    Component Value Date/Time   NA 128 (L) October 09, 2017 0555   K 6.9 (HH) 2017/10/09 0555   CL 99 (L) Oct 09, 2017 0555   CO2 16 (L) October 09, 2017 0555   GLUCOSE 58 (L) 2017-10-09 0555   BUN 62 (H) October 09, 2017 0555   CREATININE 2.89 (H) 09-Oct-2017 0555   CALCIUM 9.3 10/09/17 0555   GFRNONAA 20 (L) 2017-10-09 0555   GFRAA 23 (L) October 09, 2017 0555   Estimated Creatinine Clearance: 21.2 mL/min (A) (by C-G formula based on SCr of 2.89 mg/dL (H)).  COAG No results found for: INR, PROTIME  Radiology Dg Chest 2 View  Result Date: 09/09/2017 CLINICAL DATA:  States had been SOB x 2 months. States began coughing up "strawberry red" phlegm approx 1 month ago. States has lost about 40 pounds in  past month and half. EXAM: CHEST  2 VIEW COMPARISON:  08/30/2007 FINDINGS: There is patchy airspace opacity at the left lung base mostly silhouetting the left hemidiaphragm, new since the prior exam. Lungs show prominent bronchovascular markings but are otherwise clear. Small pleural effusions. No pneumothorax. There stable changes from prior CABG surgery. No mediastinal or hilar masses or evidence of adenopathy. Skeletal structures are intact. IMPRESSION: 1. Left lung base opacity consistent with pneumonia. 2. Small pleural effusions.  No evidence of pulmonary edema. Electronically Signed   By: Amie Portlandavid  Ormond M.D.   On: 09/09/2017 17:22   Ct Chest Wo Contrast  Result Date: 09/09/2017 CLINICAL DATA:  Dyspnea x2 months with cough and reddish phlegm approximately 1 month ago. Loss of 40 pounds in the past month and a half. EXAM: CT CHEST WITHOUT CONTRAST TECHNIQUE: Multidetector CT imaging of the chest was performed following the standard protocol without IV contrast. COMPARISON:  Same day CXR FINDINGS: Cardiovascular: Status post CABG. Mild cardiac enlargement with coronary arteriosclerosis. No pericardial effusion. There is a 4.3 cm ascending thoracic aortic aneurysm with moderate aortic atherosclerosis. The  aorta measures up to 4.9 cm at the diaphragmatic hiatus. Mediastinum/Nodes: Thickened appearing distal esophagus associated with a small hiatal hernia query esophagitis. No mediastinal adenopathy. The trachea and mainstem bronchi are patent. No thyroid mass or thyromegaly. Lungs/Pleura: Moderate bilateral pleural effusions with adjacent compressive atelectasis. Mild bronchial thickening and bronchiectasis to both lower lobes. No dominant mass or pneumothorax. No overt pulmonary edema. Upper Abdomen: Nonacute Musculoskeletal: Thoracic spondylosis. No acute nor suspicious osseous abnormalities. IMPRESSION: 1. Cardiomegaly with coronary arteriosclerosis.  Status post CABG. 2. Ascending thoracic aortic aneurysm measuring 4.3 cm with descending thoracic aortic aneurysm to 4.9 cm at the diaphragmatic hiatus. Recommend annual imaging followup by CTA or MRA. This recommendation follows 2010 ACCF/AHA/AATS/ACR/ASA/SCA/SCAI/SIR/STS/SVM Guidelines for the Diagnosis and Management of Patients with Thoracic Aortic Disease. Circulation. 2010; 121: [id number] 3. Moderate bilateral pleural effusions with compressive atelectasis. No overt pulmonary edema. 4. Mild bronchial thickening and bronchiectasis within the lower lobes. 5. Small hiatal hernia with thickened distal esophagus query esophagitis. Aortic aneurysm NOS (ICD10-I71.9). Aortic atherosclerosis ICD10 -I70. Electronically Signed   By: Tollie Ethavid  Kwon M.D.   On: 09/09/2017 18:26   Dg Chest Portable 1 View  Result Date: 2017-04-08 CLINICAL DATA:  76 year old male with chest pain and tachycardia. EXAM: PORTABLE CHEST 1 VIEW COMPARISON:  Chest abdomen and pelvis CTA 0618 hours today and earlier. FINDINGS: Portable AP upright view at 0628 hours. Sequelae of CABG. Stable cardiac size and mediastinal contours. Celsius CTA findings regarding abnormal thoracic aorta. Visualized tracheal air column is within normal limits. Small to moderate bilateral layering pleural effusions  greater on the left. No pneumothorax, pulmonary edema, or other confluent pulmonary opacity. IMPRESSION: 1. Moderate bilateral pleural effusions, better demonstrated on CTA today. 2. Thoracic and abdominal aortic aneurysms, see also CTA report. Electronically Signed   By: Odessa FlemingH  Hall M.D.   On: 2017-04-08 07:36   Ct Angio Chest/abd/pel For Dissection W And/or Wo Contrast  Result Date: 2017-04-08 CLINICAL DATA:  Unintentional weight loss.  Abdominal pain. EXAM: CT ANGIOGRAPHY CHEST, ABDOMEN AND PELVIS TECHNIQUE: Multidetector CT imaging through the chest, abdomen and pelvis was performed using the standard protocol during bolus administration of intravenous contrast. Multiplanar reconstructed images and MIPs were obtained and reviewed to evaluate the vascular anatomy. CONTRAST:  100 cc Isovue 370 IV COMPARISON:  Noncontrast Chest CT 6 days prior 09/09/2017 FINDINGS: CTA CHEST FINDINGS Cardiovascular: No aortic dissection, acute aortic  syndrome or aortic hematoma. Fusiform aneurysmal dilatation of the ascending aorta maximal dimension 4.2 cm. Descending aorta is tortuous measure approximately 4.6 cm at the diaphragmatic hiatus. Moderate calcified and noncalcified atheromatous plaque throughout. Conventional branching pattern from the aortic arch. Mild cardiomegaly. Coronary artery calcifications post CABG. Possible thrombus in the left atrial appendage. No central pulmonary embolus. Mild reflux of contrast into the hepatic veins and IVC. Mediastinum/Nodes: No mediastinal or hilar adenopathy. Thickened distal esophagus with associated hiatal hernia. No thyroid nodule. Lungs/Pleura: Moderate bilateral pleural effusions with slight increased size from prior exam. Associated compressive atelectasis. Subpleural 6 mm nodule in the right middle lobe, unchanged. No confluent airspace disease. Breathing motion artifact limits more detailed assessment. Trachea and mainstem bronchi are patent. Musculoskeletal: Degenerative  change in the spine. There are no acute or suspicious osseous abnormalities. Review of the MIP images confirms the above findings. 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 Peripheral calcifications and circumferential mural thrombus. 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 Motion limits evaluation for adjacent soft tissue stranding, no evidence of associated retroperitoneal hemorrhage. No aortic dissection. Celiac: Patent with mild luminal narrowing at the origin. No dissection or vasculitis. SMA: Patent without evidence of aneurysm, dissection, vasculitis or significant stenosis. Renals: High-grade stenosis at the origin of the left renal artery. High-grade stenosis of the proximal right renal artery just distal to the origin. IMA: Occluded at the origin, some reconstitution but small in caliber. Inflow: Left common iliac artery measures 19 mm, right common iliac artery measures 17 mm just distal to the iliac bifurcation. Moderate calcified and noncalcified atheromatous plaque. There is 50% stenosis of the right external iliac artery. High-grade stenosis of the right common femoral artery due to circumferential plaque. Visualized portion of the right SFA is small in caliber. High-grade stenosis of the left femoral artery, which is diminutive in caliber are were visualized. Probable occlusion of the proximal left superficial femoral artery in the lower most field of view. Veins: Not well assessed on this dedicated arterial study. Review of the MIP images confirms the above findings. NON-VASCULAR Hepatobiliary: No evidence of focal hepatic lesion allowing for arterial phase contrast and patient motion. Gallbladder is obscured by motion. Pancreas: No ductal dilatation or definite peripancreatic inflammation. Motion and lack of enteric contrast limits assessment for focal mass. Spleen: Normal in  size. Adrenals/Urinary Tract: Left adrenal thickening. No definite right adrenal nodule. Left renal atrophy, with slight decreased profusion compared to right. Simple cyst in the mid left kidney. Exophytic 2.4 cm lesion from the upper left kidney, nonspecific. Small cysts in the medial mid right kidney. No hydronephrosis. Urinary bladder is minimally distended. Question of bladder wall thickening. Stomach/Bowel: Limited bowel assessment given lack of enteric contrast, patient motion, and presence of intra-abdominal ascites. Stomach is nondistended. No evidence of bowel obstruction. Diverticulosis of the colon without evidence diverticulitis. Appendix obscured by motion. More detailed bowel evaluation is limited. Lymphatic: No bulky adenopathy, evaluation is limited. Reproductive: Prominent prostate gland. Other: Small volume abdominopelvic ascites. Diffuse body wall mesenteric edema suggest third-spacing. Musculoskeletal: Degenerative change in the lumbar spine. There are no acute or suspicious osseous abnormalities. Review of the MIP images confirms the above findings. IMPRESSION: 1. 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 No aortic dissection. No evidence of  aortic rupture, however motion limited evaluation. Recommend followup by abdomen and pelvis CTA in 6 months, and vascular surgery referral/consultation if not already obtained. This recommendation follows ACR consensus guidelines: White Paper of the ACR Incidental Findings Committee II on Vascular Findings. J Am Coll Radiol 2013; 10:789-794. 2. Peripheral vascular disease with severe narrowing of bilateral common femoral arteries which are diminutive in caliber were visualized, diffuse calcified and noncalcified atheromatous plaque route the external iliac and  common femoral arteries. Left superficial femoral artery is likely occluded in the lower most portion of field of view. 3. Bilateral renal artery stenosis. Left renal atrophy, decreased profusion of the left kidney compared to right is likely chronic. 4. Possible thrombus in the left atrial appendage. Recommend echocardiogram. 5. Moderate bilateral pleural effusions, with slight increase from exam 6 days prior. 6. Abdominal evaluation limited by patient motion. Diverticulosis without convincing diverticulitis. 7. Small volume intra-abdominopelvic ascites. Body wall and mesenteric edema suggest third-spacing. 8. Indeterminate exophytic lesion from the upper left kidney, previously characterized as proteinaceous cyst on abdominal MRI, with slight increase size (currently 2.4 cm previously 1.8 cm). 9. Hiatal hernia with distal esophageal wall thickening. These results were called by telephone at the time of interpretation on 08/27/2017 at 7:13 am to Dr. Lucrezia Europe , who verbally acknowledged these results. Electronically Signed   By: Rubye Oaks M.D.   On: 09/03/2017 07:16   US Abdomen Limited Ruq  Result Date: 08/21/2017 CLINICAL DATA:  Upper abdominal pain. EXAM: ULTRASOUND ABDOMEN LIMITED RIGHT UPPER QUADRANT COMPARISON:  MRI on 01/18/2008 FINDINGS: Gallbladder: No definite gallstones are seen. Diffuse gallbladder wall thickening is seen measuring up to 6 mm. No evidence of pericholecystic fluid. No sonographic Murphy sign noted by sonographer. Common bile duct: Diameter: 3 mm, within normal limits. Liver: No focal lesion identified. Within normal limits in parenchymal echogenicity. Mild perihepatic ascites is seen and subtle nodularity capsular contour of liver is seen, suspicious for hepatic cirrhosis. Portal vein is patent on color Doppler imaging with normal direction of blood flow towards the liver. Other: Small right pleural effusion. IMPRESSION: Diffuse gallbladder wall thickening, without  definite gallstones or pericholecystic fluid. This finding is nonspecific and may be due to hepatocellular disease. If clinically warranted, nuclear medicine hepatobiliary scan could be obtained for further evaluation. Mild perihepatic ascites, and possible hepatic cirrhosis. Suggest correlation with liver function tests, and consider hepatic elastography ultrasound. No evidence of biliary ductal dilatation. Small right pleural effusion incidentally noted. Electronically Signed   By: Myles Rosenthal M.D.   On: 09/16/2017 08:33      Assessment/Plan 1.  Abdominal aortic aneurysm: No surgery or intervention at this time.  Given the patient's overall condition he is not a candidate for vascular surgery or intervention.  I believe the patient has an asymptomatic abdominal aortic aneurysm that is greater than 4 cm but less than 5 cm in maximal diameter.  However, as these small aneurysms tend to enlarge over time, continued surveillance with ultrasound or CT scan is mandatory.   I would recommend optimizing medical management with hypertension and lipid control and the importance of abstinence from tobacco.  The patient should also to exercise a minimum of 30 minutes 4 times a week if he survives this hospitalization.   Should the patient develop new onset abdominal or back pain or signs of peripheral embolization they are instructed to seek medical attention immediately and to alert the physician providing care that they have an aneurysm.   2.  Atherosclerotic occlusive  disease bilateral lower extremities:  Recommend:  The patient has evidence of atherosclerosis of the lower extremities with claudication.  Given the extensive collaterals noted this appears to be a chronic long-standing problem.  I do not believe that the cyanosis of his toes is related to a change in his vascular status but rather to the impending cardiogenic shock or septic shock which has yet to be determined.    No invasive studies,  angiography or surgery at this time Once discharged from the hospital the patient should continue walking and begin a more formal exercise program.   The patient should continue antiplatelet therapy once his platelet count is recovered and aggressive treatment of the lipid abnormalities.  Given his thrombocytopenia at this time I do not recommend heparinization for treatment of his vascular condition  3.  Acute on chronic renal failure: Given his worsening renal function in association with his hyperkalemia it is likely he will require at least temporary if not permanent dialysis.  I would defer this to the ICU staff and or nephrology temporary catheter can be placed if needed.   Levora Dredge, MD  10/01/17 9:40 AM    This note was created with Dragon medical transcription system.  Any error is purely unintentional

## 2017-09-15 NOTE — ED Notes (Signed)
Pt's CBG 47 on initial check, recheck 56. MD made aware. VORB for 1amp D50. Administered per MD order.

## 2017-09-15 NOTE — H&P (Signed)
[employer] -  at [facility]   PATIENT NAME: Jimmy Hopkins    MR#:  [id number]  DATE OF BIRTH:  Jun 17, 1941  DATE OF ADMISSION:  09/05/2017  PRIMARY CARE PHYSICIAN: Dortha Kern, MD   REQUESTING/REFERRING PHYSICIAN: Rebecka Apley, MD  CHIEF COMPLAINT:   Chief Complaint  Patient presents with  . Abdominal Pain    HISTORY OF PRESENT ILLNESS:  Jimmy Hopkins  is a 76 y.o. male with a known history of CAD s/p 4-vessel CABG in 09/2012, ADHD came in for abd pain and SOB. His abdominal pain x 3-4 days, reports diarrhea, and significant weight loss of 50 pounds over the past 2 months. He has also noted a decreased appetite and has not eaten solid for for the past 4 days. While in ED he was noted to have elevated LFT's, Hyperkalemia (K 6.9) and elevated renal function. He also had Lactic acidosis, elevated troponin and Afib with RVR. He is being admitted for further eval and mgmt. PAST MEDICAL HISTORY:   Past Medical History:  Diagnosis Date  . ADHD   . CAD (coronary artery disease)    a. s/p 4-vessel CABG on 10/04/2012 at [facility] with LIMA-LAD, VG-OM1, VG-OM2, VG-PDA    PAST SURGICAL HISTORY:   Past Surgical History:  Procedure Laterality Date  . CARDIAC SURGERY    . HERNIA REPAIR      SOCIAL HISTORY:   Social History   Tobacco Use  . Smoking status: Current Every Day Smoker    Packs/day: 1.00    Types: Cigarettes  Substance Use Topics  . Alcohol use: Not on file    FAMILY HISTORY:   Family History  Problem Relation Age of Onset  . Heart disease Mother   . Heart disease Father   . Heart disease Brother     DRUG ALLERGIES:   Allergies  Allergen Reactions  . Tetracyclines & Related Hives    REVIEW OF SYSTEMS:   Review of Systems  Constitutional: Positive for malaise/fatigue and weight loss. Negative for chills and fever.  HENT: Negative for nosebleeds and sore throat.   Eyes: Negative for blurred vision.  Respiratory:  Positive for shortness of breath. Negative for cough and wheezing.   Cardiovascular: Negative for chest pain, orthopnea, leg swelling and PND.  Gastrointestinal: Positive for abdominal pain and diarrhea. Negative for constipation, heartburn, nausea and vomiting.  Genitourinary: Negative for dysuria and urgency.  Musculoskeletal: Negative for back pain.  Skin: Negative for rash.  Neurological: Positive for weakness. Negative for dizziness, speech change, focal weakness and headaches.  Endo/Heme/Allergies: Does not bruise/bleed easily.  Psychiatric/Behavioral: Negative for depression.   MEDICATIONS AT HOME:   Prior to Admission medications   Medication Sig Start Date End Date Taking? Authorizing Provider  albuterol (PROVENTIL HFA;VENTOLIN HFA) 108 (90 Base) MCG/ACT inhaler Inhale 2 puffs into the lungs every 6 (six) hours as needed for wheezing or shortness of breath. 09/09/17   Dionne Bucy, MD  methylphenidate (RITALIN) 10 MG tablet Take 1 tablet by mouth 2 (two) times daily.    [provider]  methylphenidate (RITALIN) 20 MG tablet Take 1 tablet by mouth 3 (three) times daily. 06/16/17   [provider]  potassium chloride 20 MEQ TBCR Take 20 mEq by mouth 2 (two) times daily for 14 days. Patient not taking: Reported on 08/30/2017 09/10/17 09/24/17  Dionne Bucy, MD  predniSONE (DELTASONE) 20 MG tablet Take 3 tablets (60 mg total) by mouth daily for 5 days. Patient not  taking: Reported on March 27, 2017 09/10/17 06-21-2017  Dionne BucySiadecki, Sebastian, MD      VITAL SIGNS:  Blood pressure (!) 89/64, pulse 64, temperature (!) 97.5 F (36.4 C), temperature source Oral, resp. rate 17, height 6' (1.829 m), weight 68 kg (150 lb), SpO2 100 %.  PHYSICAL EXAMINATION:  Physical Exam  GENERAL:  76 y.o.-year-old patient lying in the bed in acute resp distress. He looks critically sick and cachetic EYES: Pupils equal, round, reactive to light and accommodation. No scleral  icterus. Extraocular muscles intact.  HEENT: Head atraumatic, normocephalic. Oropharynx and nasopharynx clear.  NECK:  Supple, no jugular venous distention. No thyroid enlargement, no tenderness.  LUNGS: Decreased breath sounds bilaterally, no wheezing, rales,rhonchi or crepitation. No use of accessory muscles of respiration.  CARDIOVASCULAR: S1, S2 normal. No murmurs, rubs, or gallops.  ABDOMEN: Soft, nontender, nondistended. Bowel sounds present. No organomegaly or mass.  EXTREMITIES: No pedal edema, cyanosis, or clubbing.  NEUROLOGIC: Cranial nerves II through XII are intact. Muscle strength 5/5 in all extremities. Sensation intact. Gait not checked.  PSYCHIATRIC: The patient is alert and oriented x 3.  SKIN: No obvious rash, lesion, or ulcer.  LABORATORY PANEL:   CBC Recent Labs  Lab 06-21-2017 0555  WBC 11.6*  HGB 11.1*  HCT 34.7*  PLT 65*   ------------------------------------------------------------------------------------------------------------------  Chemistries  Recent Labs  Lab 06-21-2017 0555  NA 128*  K 6.9*  CL 99*  CO2 16*  GLUCOSE 58*  BUN 62*  CREATININE 2.89*  CALCIUM 9.3  AST 2,085*  ALT 1,777*  ALKPHOS 140*  BILITOT 3.4*   ------------------------------------------------------------------------------------------------------------------  Cardiac Enzymes Recent Labs  Lab 06-21-2017 0555  TROPONINI 0.24*   ------------------------------------------------------------------------------------------------------------------  RADIOLOGY:  Dg Chest Portable 1 View  Result Date: March 27, 2017 CLINICAL DATA:  76 year old male with chest pain and tachycardia. EXAM: PORTABLE CHEST 1 VIEW COMPARISON:  Chest abdomen and pelvis CTA 0618 hours today and earlier. FINDINGS: Portable AP upright view at 0628 hours. Sequelae of CABG. Stable cardiac size and mediastinal contours. Celsius CTA findings regarding abnormal thoracic aorta. Visualized tracheal air column is within  normal limits. Small to moderate bilateral layering pleural effusions greater on the left. No pneumothorax, pulmonary edema, or other confluent pulmonary opacity. IMPRESSION: 1. Moderate bilateral pleural effusions, better demonstrated on CTA today. 2. Thoracic and abdominal aortic aneurysms, see also CTA report. Electronically Signed   By: Odessa FlemingH  Hall M.D.   On: March 27, 2017 07:36   Ct Angio Chest/abd/pel For Dissection W And/or Wo Contrast  Result Date: March 27, 2017 CLINICAL DATA:  Unintentional weight loss.  Abdominal pain. EXAM: CT ANGIOGRAPHY CHEST, ABDOMEN AND PELVIS TECHNIQUE: Multidetector CT imaging through the chest, abdomen and pelvis was performed using the standard protocol during bolus administration of intravenous contrast. Multiplanar reconstructed images and MIPs were obtained and reviewed to evaluate the vascular anatomy. CONTRAST:  100 cc Isovue 370 IV COMPARISON:  Noncontrast Chest CT 6 days prior 09/09/2017 FINDINGS: CTA CHEST FINDINGS Cardiovascular: No aortic dissection, acute aortic syndrome or aortic hematoma. Fusiform aneurysmal dilatation of the ascending aorta maximal dimension 4.2 cm. Descending aorta is tortuous measure approximately 4.6 cm at the diaphragmatic hiatus. Moderate calcified and noncalcified atheromatous plaque throughout. Conventional branching pattern from the aortic arch. Mild cardiomegaly. Coronary artery calcifications post CABG. Possible thrombus in the left atrial appendage. No central pulmonary embolus. Mild reflux of contrast into the hepatic veins and IVC. Mediastinum/Nodes: No mediastinal or hilar adenopathy. Thickened distal esophagus with associated hiatal hernia. No thyroid nodule. Lungs/Pleura: Moderate bilateral pleural effusions with slight  increased size from prior exam. Associated compressive atelectasis. Subpleural 6 mm nodule in the right middle lobe, unchanged. No confluent airspace disease. Breathing motion artifact limits more detailed assessment.  Trachea and mainstem bronchi are patent. Musculoskeletal: Degenerative change in the spine. There are no acute or suspicious osseous abnormalities. Review of the MIP images confirms the above findings. 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 Peripheral calcifications and circumferential mural thrombus. 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 Motion limits evaluation for adjacent soft tissue stranding, no evidence of associated retroperitoneal hemorrhage. No aortic dissection. Celiac: Patent with mild luminal narrowing at the origin. No dissection or vasculitis. SMA: Patent without evidence of aneurysm, dissection, vasculitis or significant stenosis. Renals: High-grade stenosis at the origin of the left renal artery. High-grade stenosis of the proximal right renal artery just distal to the origin. IMA: Occluded at the origin, some reconstitution but small in caliber. Inflow: Left common iliac artery measures 19 mm, right common iliac artery measures 17 mm just distal to the iliac bifurcation. Moderate calcified and noncalcified atheromatous plaque. There is 50% stenosis of the right external iliac artery. High-grade stenosis of the right common femoral artery due to circumferential plaque. Visualized portion of the right SFA is small in caliber. High-grade stenosis of the left femoral artery, which is diminutive in caliber are were visualized. Probable occlusion of the proximal left superficial femoral artery in the lower most field of view. Veins: Not well assessed on this dedicated arterial study. Review of the MIP images confirms the above findings. NON-VASCULAR Hepatobiliary: No evidence of focal hepatic lesion allowing for arterial phase contrast and patient motion. Gallbladder is obscured by motion. Pancreas: No ductal dilatation or definite peripancreatic inflammation. Motion and lack of  enteric contrast limits assessment for focal mass. Spleen: Normal in size. Adrenals/Urinary Tract: Left adrenal thickening. No definite right adrenal nodule. Left renal atrophy, with slight decreased profusion compared to right. Simple cyst in the mid left kidney. Exophytic 2.4 cm lesion from the upper left kidney, nonspecific. Small cysts in the medial mid right kidney. No hydronephrosis. Urinary bladder is minimally distended. Question of bladder wall thickening. Stomach/Bowel: Limited bowel assessment given lack of enteric contrast, patient motion, and presence of intra-abdominal ascites. Stomach is nondistended. No evidence of bowel obstruction. Diverticulosis of the colon without evidence diverticulitis. Appendix obscured by motion. More detailed bowel evaluation is limited. Lymphatic: No bulky adenopathy, evaluation is limited. Reproductive: Prominent prostate gland. Other: Small volume abdominopelvic ascites. Diffuse body wall mesenteric edema suggest third-spacing. Musculoskeletal: Degenerative change in the lumbar spine. There are no acute or suspicious osseous abnormalities. Review of the MIP images confirms the above findings. IMPRESSION: 1. 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 No aortic dissection. No evidence of aortic rupture, however motion limited evaluation. Recommend followup by abdomen and pelvis CTA in 6 months, and vascular surgery referral/consultation if not already obtained. This recommendation follows ACR consensus guidelines: White Paper of the ACR Incidental Findings Committee II on Vascular Findings. J Am Coll Radiol 2013; 10:789-794. 2. Peripheral vascular disease with severe narrowing of bilateral common femoral arteries which are diminutive in caliber were visualized, diffuse calcified  and noncalcified atheromatous plaque route the external iliac and common femoral arteries. Left superficial femoral artery is likely occluded in the lower  most portion of field of view. 3. Bilateral renal artery stenosis. Left renal atrophy, decreased profusion of the left kidney compared to right is likely chronic. 4. Possible thrombus in the left atrial appendage. Recommend echocardiogram. 5. Moderate bilateral pleural effusions, with slight increase from exam 6 days prior. 6. Abdominal evaluation limited by patient motion. Diverticulosis without convincing diverticulitis. 7. Small volume intra-abdominopelvic ascites. Body wall and mesenteric edema suggest third-spacing. 8. Indeterminate exophytic lesion from the upper left kidney, previously characterized as proteinaceous cyst on abdominal MRI, with slight increase size (currently 2.4 cm previously 1.8 cm). 9. Hiatal hernia with distal esophageal wall thickening. These results were called by telephone at the time of interpretation on 10/08/17 at 7:13 am to Dr. Lucrezia Europe , who verbally acknowledged these results. Electronically Signed   By: Rubye Oaks M.D.   On: 10/08/17 07:16   US Abdomen Limited Ruq  Result Date: 2017-10-08 CLINICAL DATA:  Upper abdominal pain. EXAM: ULTRASOUND ABDOMEN LIMITED RIGHT UPPER QUADRANT COMPARISON:  MRI on 01/18/2008 FINDINGS: Gallbladder: No definite gallstones are seen. Diffuse gallbladder wall thickening is seen measuring up to 6 mm. No evidence of pericholecystic fluid. No sonographic Murphy sign noted by sonographer. Common bile duct: Diameter: 3 mm, within normal limits. Liver: No focal lesion identified. Within normal limits in parenchymal echogenicity. Mild perihepatic ascites is seen and subtle nodularity capsular contour of liver is seen, suspicious for hepatic cirrhosis. Portal vein is patent on color Doppler imaging with normal direction of blood flow towards the liver. Other: Small right pleural  effusion. IMPRESSION: Diffuse gallbladder wall thickening, without definite gallstones or pericholecystic fluid. This finding is nonspecific and may be due to hepatocellular disease. If clinically warranted, nuclear medicine hepatobiliary scan could be obtained for further evaluation. Mild perihepatic ascites, and possible hepatic cirrhosis. Suggest correlation with liver function tests, and consider hepatic elastography ultrasound. No evidence of biliary ductal dilatation. Small right pleural effusion incidentally noted. Electronically Signed   By: Myles Rosenthal M.D.   On: Oct 08, 2017 08:33   IMPRESSION AND PLAN:  8 y m with multiorgan failure of unknown etio  * Acute Metabolic Encephalopathy - likely due to multiorgan failure and Lactic acidosis - Monitor  * Multiorgan failure - Unsure etio - no obvious s/s of infection. - will hold off Abx  * Hyperkalemia - K 6.9 - recheck now - given calcium gluconate, insulin, dextrose, albuterol, sodium bicarbonate and lasix in ED  * ARF: -Possibly ATN/Prerenal in the setting of hypovolemia  - CT shows b/l renal artery stenosis - Apprciate Vascular surgery input, no intervention planned for now -Nephro c/s - d/w Dr Wynelle Link  * Shock Liver - LFT's in thousands - unsure etio, could be due to vascular etio  * Acute CHF - BNP 3665 -Echo -Diurese as able per cardio  * Elevated troponin: -likely due to demand ischemia, serial troponins -Check echo - Cardio c/s  * Atherosclerotic occlusive disease bilateral lower extremities - appreciate vascular input  * New onset Afib with RVR: - Cardio c/s - echo, not a candidate for anticoagulation as high risk for bleeding due to acute thrombocytopenia  * Acute Thrombocytopenia - Platelets dropped from 169->65 - no antiplatelets or heparin products for now  * Failure to thrive/weight loss: 50 lbs in 2 months -Has not been eating or drinking for the past 4 days -Concerning for underlying  malignancy  - CT C-A-P neg for any obvious malignancy  * Leukocytosis: -Blood culture pending - Initially started Vanco + zosyn but  after d/w Dr Thomos LemonsSimonds I've held off Abx until further eval by them    Patient is critically sick and high risk for cardio-resp arrest and death due to multiorgan failure  Patient will be admitted to ICU - case d/w Dr Sung AmabileSimonds    All the records are reviewed and case discussed with ED provider. Management plans discussed with the patient, Dr Sung AmabileSimonds, Dr Wynelle LinkKolluru and they are in agreement.  CODE STATUS: full code  TOTAL TIME (Critical Care) TAKING CARE OF THIS PATIENT: 45 minutes.    Delfino LovettVipul Laya Letendre M.D on 09-27-2017 at 11:17 AM  Between 7am to 6pm - Pager - [phone]  After 6pm go to [email or web address] - Social research officer, governmentpassword [email or web address] [email or web address]  [employer] [place] [facility]  Office  [phone]  CC: Primary care physician; Dortha KernBliss, Laura K, MD   Note: This dictation was prepared with Dragon dictation along with smaller phrase technology. Any transcriptional errors that result from this process are unintentional.

## 2017-09-15 NOTE — ED Notes (Signed)
Patient transported to CT 

## 2017-09-15 NOTE — ED Notes (Signed)
Date and time results received: 09/14/2017 0717  Test: Lactic Critical Value: 5.5  Name of Provider Notified: Dr. Zenda AlpersWebster, Dr. Marisa SeverinSiadecki  Orders Received? Or Actions Taken?: Critical Results Acknowledged, MDs at bedside at this time.

## 2017-09-15 NOTE — ED Notes (Signed)
MD called to bedside due to patient c/o R 2nd toe pain with sudden onset. Upon assessment, bilateral feet are noted to be dusky and gray with poor cap refill, pt states this is not his normal. MD attempted to doppler pulses bilaterally, unable to do so.

## 2017-09-15 NOTE — ED Notes (Signed)
Dr. Gilda CreaseSchnier at bedside at this time. Pt is noted to be agitated and attempting to get up and severely confused. Pt family at bedside at this time.

## 2017-09-15 NOTE — ED Notes (Signed)
US at bedside at this time 

## 2017-09-15 NOTE — ED Notes (Signed)
Date and time results received: 10/12/17 1055  Test: Lactic Critical Value: 3.6  Name of Provider Notified: Dr. Sherryll BurgerShah  Orders Received? Or Actions Taken?: Acknowledged

## 2017-09-15 NOTE — Consult Note (Signed)
[facility]  CONSULT NOTE    Date: 08/21/2017                  Patient Name:  Jimmy Hopkins  MRN: [id number]  DOB: 12/31/1940  Age / Sex: 76 y.o., male         PCP: Dortha Kern, MD                 Service Requesting Consult: Dr. Sung Amabile                 Reason for Consult: Acute renal failure            History of Present Illness: [name] is a 76 y.o. white male with ,hypertension, coronary artery disease status post CABG, atrial fibrillation, bilateral renal artery stenosis, peripheral arterial disease, mural thrombus, AAA, thrombocytopenia who was admitted to [facility] on 09/14/2017 for Hyperkalemia [E87.5] Peripheral vascular disease (HCC) [I73.9] Thoracic aortic aneurysm without rupture (HCC) [I71.2] Pain of upper abdomen [R10.10] Abdominal pain [R10.9] Sepsis, due to unspecified organism (HCC) [A41.9] Acute renal failure, unspecified acute renal failure type (HCC) [N17.9] Abdominal aortic aneurysm (AAA) without rupture (HCC) [I71.4] Acute liver failure without hepatic coma [K72.00]  Patient with acute renal failure with hyperkalemia, hyponatremia, metabolic acidosis. Started on sodium bicarbonate drip with urine output and improved renal function.    Medications: Outpatient medications: Medications Prior to Admission  Medication Sig Dispense Refill Last Dose  . albuterol (PROVENTIL HFA;VENTOLIN HFA) 108 (90 Base) MCG/ACT inhaler Inhale 2 puffs into the lungs every 6 (six) hours as needed for wheezing or shortness of breath. 1 Inhaler 2 unknown at unknown  . methylphenidate (RITALIN) 10 MG tablet Take 1 tablet by mouth 2 (two) times daily.   unknown at unknown  . methylphenidate (RITALIN) 20 MG tablet Take 1 tablet by mouth 3 (three) times daily.   unknown at unknown  . potassium chloride 20 MEQ TBCR Take 20 mEq by mouth 2 (two) times daily for 14 days. (Patient not taking: Reported on 09/14/2017) 28 tablet 0 Not Taking at Unknown time   . predniSONE (DELTASONE) 20 MG tablet Take 3 tablets (60 mg total) by mouth daily for 5 days. (Patient not taking: Reported on 08/22/2017) 15 tablet 0 Not Taking at Unknown time    Current medications: Current Facility-Administered Medications  Medication Dose Route Frequency Provider Last Rate Last Dose  . acetaminophen (TYLENOL) tablet 650 mg  650 mg Oral Q6H PRN Delfino Lovett, MD       Or  . acetaminophen (TYLENOL) suppository 650 mg  650 mg Rectal Q6H PRN Delfino Lovett, MD      . bisacodyl (DULCOLAX) EC tablet 5 mg  5 mg Oral Daily PRN Delfino Lovett, MD      . calcium gluconate 10 % injection           . dexmedetomidine (PRECEDEX) 400 MCG/100ML (4 mcg/mL) infusion  0.4-0.8 mcg/kg/hr Intravenous Titrated Eugenie Norrie, NP 13.6 mL/hr at 08/26/2017 1506 0.8 mcg/kg/hr at 09/13/2017 1506  . fentaNYL (SUBLIMAZE) injection 12.5-25 mcg  12.5-25 mcg Intravenous Q4H PRN Eugenie Norrie, NP      . ondansetron (ZOFRAN) injection 4 mg  4 mg Intravenous Q6H PRN Delfino Lovett, MD      . piperacillin-tazobactam (ZOSYN) IVPB 3.375 g  3.375 g Intravenous Q12H Shah, Vipul, MD      . sodium bicarbonate 150 mEq in dextrose 5 % 1,000 mL infusion   Intravenous Continuous Blakeney,  Neldon Newportana G, NP 50 mL/hr at 02/19/17 1421        Allergies: Allergies  Allergen Reactions  . Tetracyclines & Related Hives      Past Medical History: Past Medical History:  Diagnosis Date  . ADHD   . CAD (coronary artery disease)    a. s/p 4-vessel CABG on 10/04/2012 at [facility] with LIMA-LAD, VG-OM1, VG-OM2, VG-PDA     Past Surgical History: Past Surgical History:  Procedure Laterality Date  . CARDIAC SURGERY    . HERNIA REPAIR       Family History: Family History  Problem Relation Age of Onset  . Heart disease Mother   . Heart disease Father   . Heart disease Brother      Social History: Social History   Socioeconomic History  . Marital status: Single    Spouse name: Not on file  . Number of children: Not on file   . Years of education: Not on file  . Highest education level: Not on file  Social Needs  . Financial resource strain: Not on file  . Food insecurity - worry: Not on file  . Food insecurity - inability: Not on file  . Transportation needs - medical: Not on file  . Transportation needs - non-medical: Not on file  Occupational History  . Not on file  Tobacco Use  . Smoking status: Current Every Day Smoker    Packs/day: 1.00    Types: Cigarettes  Substance and Sexual Activity  . Alcohol use: Not on file  . Drug use: Not on file  . Sexual activity: Not on file  Other Topics Concern  . Not on file  Social History Narrative  . Not on file     Review of Systems: Review of Systems  Unable to perform ROS: Critical illness    Vital Signs: Blood pressure 124/74, pulse 91, temperature (!) 96.2 F (35.7 C), temperature source Axillary, resp. rate (!) 25, height 6' (1.829 m), weight 72 kg (158 lb 11.7 oz), SpO2 100 %.  Weight trends: Filed Weights   02/19/17 0557 02/19/17 1130  Weight: 68 kg (150 lb) 72 kg (158 lb 11.7 oz)    Physical Exam: General: Critically ill  Head: Normocephalic, atraumatic. Dry oral mucosal membranes  Eyes: Anicteric, PERRL  Neck: Supple, trachea midline  Lungs:  Clear to auscultation  Heart: irregular  Abdomen:  Soft, nontender,   Extremities: no peripheral edema.  Neurologic: Nonfocal, moving all four extremities  Skin: No lesions  GU: Condom catheter     Lab results: Basic Metabolic Panel: Recent Labs  Lab 09/09/17 1654 02/19/17 0555 02/19/17 1140  NA 136 128* 131*  K 2.8* 6.9* 5.5*  CL 104 99* 101  CO2 21* 16* 16*  GLUCOSE 108* 58* 94  BUN 16 62* 56*  CREATININE 1.50* 2.89* 2.66*  CALCIUM 8.9 9.3 8.4*  MG  --   --  2.3    Liver Function Tests: Recent Labs  Lab 09/09/17 1654 02/19/17 0555 02/19/17 1140  AST 21 2,085* 1,460*  ALT 8* 1,777* 1,454*  ALKPHOS 83 140* 107  BILITOT 0.9 3.4* 3.0*  PROT 6.8 6.5 5.5*  ALBUMIN  3.2* 3.6 3.1*   Recent Labs  Lab 02/19/17 0555  LIPASE 52*   No results for input(s): AMMONIA in the last 168 hours.  CBC: Recent Labs  Lab 09/09/17 1654 02/19/17 0555  WBC 5.2 11.6*  HGB 12.1* 11.1*  HCT 36.6* 34.7*  MCV 87.9 89.2  PLT 169 65*  Cardiac Enzymes: Recent Labs  Lab 09/09/17 1654 09/09/17 2027 09/02/2017 0555 08/30/2017 1140  TROPONINI 0.03* 0.03* 0.24* 0.23*    BNP: Invalid input(s): POCBNP  CBG: Recent Labs  Lab 08/19/2017 0902 08/29/2017 0906 08/22/2017 0933 08/20/2017 1037 09/07/2017 1054  GLUCAP 47* 56* 104* 120* 121*    Microbiology: No results found for this or any previous visit.  Coagulation Studies: Recent Labs    09/06/2017 1140  LABPROT 34.8*  INR 3.49    Urinalysis: Recent Labs    09/09/2017 0700 08/26/2017 1259  COLORURINE YELLOW* STRAW*  LABSPEC 1.014 1.010  PHURINE 5.0 5.0  GLUCOSEU NEGATIVE NEGATIVE  HGBUR NEGATIVE SMALL*  BILIRUBINUR NEGATIVE NEGATIVE  KETONESUR NEGATIVE NEGATIVE  PROTEINUR NEGATIVE NEGATIVE  NITRITE NEGATIVE NEGATIVE  LEUKOCYTESUR NEGATIVE NEGATIVE      Imaging: Dg Chest Portable 1 View  Result Date: 09/14/2017 CLINICAL DATA:  76 year old male with chest pain and tachycardia. EXAM: PORTABLE CHEST 1 VIEW COMPARISON:  Chest abdomen and pelvis CTA 0618 hours today and earlier. FINDINGS: Portable AP upright view at 0628 hours. Sequelae of CABG. Stable cardiac size and mediastinal contours. Celsius CTA findings regarding abnormal thoracic aorta. Visualized tracheal air column is within normal limits. Small to moderate bilateral layering pleural effusions greater on the left. No pneumothorax, pulmonary edema, or other confluent pulmonary opacity. IMPRESSION: 1. Moderate bilateral pleural effusions, better demonstrated on CTA today. 2. Thoracic and abdominal aortic aneurysms, see also CTA report. Electronically Signed   By: Odessa FlemingH  Hall M.D.   On: 09/10/2017 07:36   Ct Angio Chest/abd/pel For Dissection W And/or Wo  Contrast  Result Date: 08/19/2017 CLINICAL DATA:  Unintentional weight loss.  Abdominal pain. EXAM: CT ANGIOGRAPHY CHEST, ABDOMEN AND PELVIS TECHNIQUE: Multidetector CT imaging through the chest, abdomen and pelvis was performed using the standard protocol during bolus administration of intravenous contrast. Multiplanar reconstructed images and MIPs were obtained and reviewed to evaluate the vascular anatomy. CONTRAST:  100 cc Isovue 370 IV COMPARISON:  Noncontrast Chest CT 6 days prior 09/09/2017 FINDINGS: CTA CHEST FINDINGS Cardiovascular: No aortic dissection, acute aortic syndrome or aortic hematoma. Fusiform aneurysmal dilatation of the ascending aorta maximal dimension 4.2 cm. Descending aorta is tortuous measure approximately 4.6 cm at the diaphragmatic hiatus. Moderate calcified and noncalcified atheromatous plaque throughout. Conventional branching pattern from the aortic arch. Mild cardiomegaly. Coronary artery calcifications post CABG. Possible thrombus in the left atrial appendage. No central pulmonary embolus. Mild reflux of contrast into the hepatic veins and IVC. Mediastinum/Nodes: No mediastinal or hilar adenopathy. Thickened distal esophagus with associated hiatal hernia. No thyroid nodule. Lungs/Pleura: Moderate bilateral pleural effusions with slight increased size from prior exam. Associated compressive atelectasis. Subpleural 6 mm nodule in the right middle lobe, unchanged. No confluent airspace disease. Breathing motion artifact limits more detailed assessment. Trachea and mainstem bronchi are patent. Musculoskeletal: Degenerative change in the spine. There are no acute or suspicious osseous abnormalities. Review of the MIP images confirms the above findings. 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 Peripheral calcifications and circumferential mural thrombus. Aneurysm begins just inferior to bilateral renal arteries and extends 2.5 cm  from the iliac bifurcation. Motion limits evaluation for adjacent soft tissue stranding, no evidence of associated retroperitoneal hemorrhage. No aortic dissection. Celiac: Patent with mild luminal narrowing at the origin. No dissection or vasculitis. SMA: Patent without evidence of aneurysm, dissection, vasculitis or significant stenosis. Renals: High-grade stenosis at the origin of the left renal artery. High-grade stenosis of the proximal  right renal artery just distal to the origin. IMA: Occluded at the origin, some reconstitution but small in caliber. Inflow: Left common iliac artery measures 19 mm, right common iliac artery measures 17 mm just distal to the iliac bifurcation. Moderate calcified and noncalcified atheromatous plaque. There is 50% stenosis of the right external iliac artery. High-grade stenosis of the right common femoral artery due to circumferential plaque. Visualized portion of the right SFA is small in caliber. High-grade stenosis of the left femoral artery, which is diminutive in caliber are were visualized. Probable occlusion of the proximal left superficial femoral artery in the lower most field of view. Veins: Not well assessed on this dedicated arterial study. Review of the MIP images confirms the above findings. NON-VASCULAR Hepatobiliary: No evidence of focal hepatic lesion allowing for arterial phase contrast and patient motion. Gallbladder is obscured by motion. Pancreas: No ductal dilatation or definite peripancreatic inflammation. Motion and lack of enteric contrast limits assessment for focal mass. Spleen: Normal in size. Adrenals/Urinary Tract: Left adrenal thickening. No definite right adrenal nodule. Left renal atrophy, with slight decreased profusion compared to right. Simple cyst in the mid left kidney. Exophytic 2.4 cm lesion from the upper left kidney, nonspecific. Small cysts in the medial mid right kidney. No hydronephrosis. Urinary bladder is minimally distended.  Question of bladder wall thickening. Stomach/Bowel: Limited bowel assessment given lack of enteric contrast, patient motion, and presence of intra-abdominal ascites. Stomach is nondistended. No evidence of bowel obstruction. Diverticulosis of the colon without evidence diverticulitis. Appendix obscured by motion. More detailed bowel evaluation is limited. Lymphatic: No bulky adenopathy, evaluation is limited. Reproductive: Prominent prostate gland. Other: Small volume abdominopelvic ascites. Diffuse body wall mesenteric edema suggest third-spacing. Musculoskeletal: Degenerative change in the lumbar spine. There are no acute or suspicious osseous abnormalities. Review of the MIP images confirms the above findings. IMPRESSION: 1. 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 No aortic dissection. No evidence of aortic rupture, however motion limited evaluation. Recommend followup by abdomen and pelvis CTA in 6 months, and vascular surgery referral/consultation if not already obtained. This recommendation follows ACR consensus guidelines: White Paper of the ACR Incidental Findings Committee II on Vascular Findings. J Am Coll Radiol 2013; 10:789-794. 2. Peripheral vascular disease with severe narrowing of bilateral common femoral arteries which are diminutive in caliber were visualized, diffuse calcified and noncalcified atheromatous plaque route the external iliac and common femoral arteries. Left superficial femoral artery is likely occluded in the lower most portion of field of view. 3. Bilateral renal artery stenosis. Left renal atrophy, decreased profusion of the left kidney compared to right is likely chronic. 4. Possible thrombus in the left atrial appendage. Recommend echocardiogram. 5. Moderate bilateral pleural  effusions, with slight increase from exam 6 days prior. 6. Abdominal evaluation limited by patient motion. Diverticulosis without convincing diverticulitis. 7. Small volume intra-abdominopelvic ascites. Body wall and mesenteric edema suggest third-spacing. 8. Indeterminate exophytic lesion from the upper left kidney, previously characterized as proteinaceous cyst on abdominal MRI, with slight increase size (currently 2.4 cm previously 1.8 cm). 9. Hiatal hernia with distal esophageal wall thickening. These results were called by telephone at the time of interpretation on 2017-09-29 at 7:13 am to Dr. Lucrezia Europe , who verbally acknowledged these results. Electronically Signed   By: Rubye Oaks M.D.   On: 09/29/2017 07:16   US Abdomen Limited Ruq  Result Date: 08/31/2017 CLINICAL DATA:  Upper abdominal pain. EXAM: ULTRASOUND ABDOMEN LIMITED RIGHT UPPER QUADRANT COMPARISON:  MRI on 01/18/2008 FINDINGS: Gallbladder: No definite gallstones are seen. Diffuse gallbladder wall thickening is seen measuring up to 6 mm. No evidence of pericholecystic fluid. No sonographic Murphy sign noted by sonographer. Common bile duct: Diameter: 3 mm, within normal limits. Liver: No focal lesion identified. Within normal limits in parenchymal echogenicity. Mild perihepatic ascites is seen and subtle nodularity capsular contour of liver is seen, suspicious for hepatic cirrhosis. Portal vein is patent on color Doppler imaging with normal direction of blood flow towards the liver. Other: Small right pleural effusion. IMPRESSION: Diffuse gallbladder wall thickening, without definite gallstones or pericholecystic fluid. This finding is nonspecific and may be due to hepatocellular disease. If clinically warranted, nuclear medicine hepatobiliary scan could be obtained for further evaluation. Mild perihepatic ascites, and possible hepatic cirrhosis. Suggest correlation with liver function tests, and consider hepatic elastography  ultrasound. No evidence of biliary ductal dilatation. Small right pleural effusion incidentally noted. Electronically Signed   By: Myles Rosenthal M.D.   On: 09/13/2017 08:33      Assessment & Plan: Jimmy Hopkins is a 76 y.o. white male with ,hypertension, coronary artery disease status post CABG, atrial fibrillation, bilateral renal artery stenosis, peripheral arterial disease, mural thrombus, AAA, thrombocytopenia who was admitted to [facility] on 08/22/2017 for Hyperkalemia [E87.5] Peripheral vascular disease (HCC) [I73.9] Thoracic aortic aneurysm without rupture (HCC) [I71.2] Pain of upper abdomen [R10.10] Abdominal pain [R10.9] Sepsis, due to unspecified organism (HCC) [A41.9] Acute renal failure, unspecified acute renal failure type (HCC) [N17.9] Abdominal aortic aneurysm (AAA) without rupture (HCC) [I71.4] Acute liver failure without hepatic coma [K72.00]  1. Acute Renal Failure: with hyperkalemia, hyponatremia, metabolic acidosis: on chronic kidney disease stage III: baseline creatinine of 1.5 GFR of 44 on 09/09/17 Chronic kidney disease secondary to hypertension and renal vascular disease. Bland urinalysis Acute renal failure from sepsis, ATN and prerenal azotemia Hyperkalemia secondary to PO potassium chloride. Status post calcium gluconate, dextrose, furosemide nonoliguric urine output. Responding to IV fluids No acute indication for dialysis.  Discontinued PO potassium - Continue sodium bicarb gtt.   2. Sepsis: with leukocytosis and thrombocytopenia. Required norepinephrine earlier today. Now weaned off.  Empiric antibiotics: pip/tazo and vanco.     LOS: 0 Kethan Papadopoulos 11/28/20184:06 PM

## 2017-09-15 NOTE — Consult Note (Signed)
Cardiology Consultation:   Patient ID: Jimmy Hopkins; [id number]; Dec 09, 1940   Admit date: 08/19/2017 Date of Consult: 08/27/2017  Primary Care Provider: Lynnell Jude, MD Primary Cardiologist: New to [facility] - consult by Rockey Situ   Patient Profile:   Jimmy Hopkins is a 76 y.o. male with a hx of CAD s/p 4-vessel CABG in 09/2012, ADHD, and has not followed up with a physician in many years who is being seen today for the evaluation of elevated troponin and Afib with RVR at the request of Dr. Manuella Ghazi.  History of Present Illness:   [name] was previously followed by Dr. Clayborn Bigness, though has not seen a cardiologist since 2013. He underwent 4-vessel CABG (LIMA-LAD, VG-OM1, VG-OM2, VG-PDA) with mitral valve anuloplasty on 10/04/2012. He has been lost to follow up since. He apparently lived by himself up until the week prior when his sister took him in. He was seen in the ED on 11/22 with 2 month history of SOB. Troponin 0.03 x 2, Na 136, K+ 2.8, BUN 16, SCr 1.50, albumin 3.2, AST 21, ALT 8, alkp hos 83, WBC 5.2, HGB 12.1, PLT 169. CXR with concern for left basilar PNA. CT chest without contrast showed ascending thoracic aortic aneurysm measuring 4.3 cm with descending thoracic aortic aneurysm to 4.9 cm, moderate bilateral pleural effusions with compressive atelectasis, mild bronchial thickening and bronchiectasis. EKG showed likely Afib with RVR with IVCD. He was given potassium, nebulizers, and steroids in the ED and discharged home.   The patient re-presented to [facility] on 11/28 with intractable abdominal pain x 3-4 days, diarrhea, and significant weight loss of 50 pounds over the past 2 months. He has also noted a decreased appetite and has not eaten solid for for the past 4 days. He will wake up in the middle of the night screaming in pain from her abdomen. Upon his arrival to the ED he was noted to have stable BP. EKG showed Afib as below. Lactic acid 5.5, BNP 3665, troponin 0.24, WBC  11.6, HGB 11.1, PLT 65 (prior 169 6 days ago), Na 128 (136 6 days prior), K+ 6.9, BUN 62, SCr 2.89, ASt 2085, 1777, alk phos 140, t bili 3.4. 6 days prior he was noted to have normal liver function. Lipase 52. CXR showed moderate bilateral pleural effusions with thoracic and abdominal aortic aneurysms. 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 He has been seen by vascular surgery and deemed to not have acute surgical indication for the above PVD and aortic aneurysms. Continued medical therapy has been advised. He is not a candidate for heparin given his thrombocytopenia and liver failure. He is accompanied by his sister today. She denies the patient ever complaining of chest pain. She does not know of any prior history of Afib, though he has not seen an MD in years.     Past Medical History:  Diagnosis Date  . ADHD   . CAD (coronary artery disease)    a. s/p 4-vessel CABG on 10/04/2012 at [place] with LIMA-LAD, VG-OM1, VG-OM2, VG-PDA    Past Surgical History:  Procedure Laterality Date  . CARDIAC SURGERY    . HERNIA REPAIR  Home Meds: Prior to Admission medications   Medication Sig Start Date End Date Taking? Authorizing Provider  albuterol (PROVENTIL HFA;VENTOLIN HFA) 108 (90 Base) MCG/ACT inhaler Inhale 2 puffs into the lungs every 6 (six) hours as needed for wheezing or shortness of breath. 09/09/17   Arta Silence, MD  methylphenidate (RITALIN) 10 MG tablet Take 1 tablet by mouth 2 (two) times daily.    [provider]  methylphenidate (RITALIN) 20 MG tablet Take 1 tablet by mouth 3  (three) times daily. 06/16/17   [provider]  potassium chloride 20 MEQ TBCR Take 20 mEq by mouth 2 (two) times daily for 14 days. Patient not taking: Reported on 09/08/2017 09/10/17 09/24/17  Arta Silence, MD  predniSONE (DELTASONE) 20 MG tablet Take 3 tablets (60 mg total) by mouth daily for 5 days. Patient not taking: Reported on 08/25/2017 09/10/17 09/14/2017  Arta Silence, MD    Inpatient Medications: Scheduled Meds: . calcium gluconate       Continuous Infusions: . piperacillin-tazobactam (ZOSYN)  IV     PRN Meds:   Allergies:   Allergies  Allergen Reactions  . Tetracyclines & Related Hives    Social History:   Social History   Socioeconomic History  . Marital status: Single    Spouse name: Not on file  . Number of children: Not on file  . Years of education: Not on file  . Highest education level: Not on file  Social Needs  . Financial resource strain: Not on file  . Food insecurity - worry: Not on file  . Food insecurity - inability: Not on file  . Transportation needs - medical: Not on file  . Transportation needs - non-medical: Not on file  Occupational History  . Not on file  Tobacco Use  . Smoking status: Current Every Day Smoker    Packs/day: 1.00    Types: Cigarettes  Substance and Sexual Activity  . Alcohol use: Not on file  . Drug use: Not on file  . Sexual activity: Not on file  Other Topics Concern  . Not on file  Social History Narrative  . Not on file     Family History:  Family History  Problem Relation Age of Onset  . Heart disease Mother   . Heart disease Father   . Heart disease Brother     ROS:  Review of Systems  Unable to perform ROS: Critical illness      Physical Exam/Data:   Vitals:   09/14/2017 0955 08/29/2017 1012 08/24/2017 1015 08/24/2017 1016  BP:  100/67 (!) 89/64   Pulse: (!) 53 76  64  Resp: 13 14 14 17   Temp:      TempSrc:      SpO2: 100% 100%  100%  Weight:      Height:         Intake/Output Summary (Last 24 hours) at 08/30/2017 1038 Last data filed at 09/06/2017 0647 Gross per 24 hour  Intake 100 ml  Output -  Net 100 ml   Filed Weights   08/19/2017 0557  Weight: 150 lb (68 kg)   Body mass index is 20.34 kg/m.   Physical Exam: General: Ill, frail, and pale appearing, in no acute distress. Head: Normocephalic, atraumatic, sclera non-icteric, no xanthomas, nares without discharge.  Neck: Negative for carotid bruits. JVD not elevated. Lungs: Diminished breath sounds bilaterally. Breathing is unlabored. Heart: Irregularly irregular with S1 S2. No murmurs, rubs, or gallops appreciated. Abdomen: Soft, diffusely tender, non-distended with  hypoactive bowel sounds. No hepatomegaly. No rebound/guarding. No obvious abdominal masses. Msk:  Strength and tone appear normal for age. Extremities: No clubbing or cyanosis. No edema. Distal pedal pulses are 2+ and equal bilaterally. Neuro: Somnolent. Psych:  Somnolent.   EKG:  The EKG was personally reviewed and demonstrates: Afib with RVR, 121 bpm, nonspecific IVCD, nonspecific st/t changes. Repeat EKG showed Afib with RVR, 106 bpm, nonspecific IVCD, nonspecific st/t changes Telemetry:  Telemetry was personally reviewed and demonstrates: Afib 80s to low 100s bpm   Weights: Filed Weights   09/07/2017 0557  Weight: 150 lb (68 kg)    Relevant CV Studies: Cardiothoracic surgery 10/04/2012: PROCEDURES  CABG x 4  Transesophageal echocardiogram  Mitral annuloplasty GRAFT MATRIX  Coronary  Graft      Target   Graft    Distal         Technique   End  Artery    Source     Artery   Quality  Suture-Device  Run/Inter/                       Quality                          Robotics  --------  ---------  -------  -------  -------------  ----------  ---  1 L PDA   Saphenous  Poor     Poor     7/0            Run              vein  2 OM2     Saphenous  Fair     Fair     7/0            Run              vein  3 OM1      Saphenous  Good     Good     7/0            Run              vein  4 LAD     L-IMA      Good     Good     7/0            Run  Coronary  Grafting  Proximal       Mean  Artery              Suture-Device  Flow  --------  --------  -------------  -----  1 L PDA   Distal    6/0              first  2 OM2     Distal    6/0              first  3 OM1     Distal    6/0              first  4 LAD Laboratory Data:  Chemistry Recent Labs  Lab 09/09/17 1654 09/05/2017 0555  NA 136 128*  K 2.8* 6.9*  CL 104 99*  CO2 21* 16*  GLUCOSE 108* 58*  BUN 16 62*  CREATININE 1.50* 2.89*  CALCIUM 8.9 9.3  GFRNONAA 44* 20*  GFRAA 51* 23*  ANIONGAP 11 13    Recent Labs  Lab 09/09/17 1654 09/12/2017 0555  PROT 6.8 6.5  ALBUMIN 3.2* 3.6  AST 21 2,085*  ALT 8* 1,777*  ALKPHOS 83 140*  BILITOT 0.9 3.4*   Hematology Recent Labs  Lab 09/09/17 1654 08/20/2017 0555  WBC 5.2 11.6*  RBC 4.16* 3.89*  HGB 12.1* 11.1*  HCT 36.6* 34.7*  MCV 87.9 89.2  MCH 29.1 28.6  MCHC 33.1 32.1  RDW 19.8* 19.6*  PLT 169 65*   Cardiac Enzymes Recent Labs  Lab 09/09/17 1654 09/09/17 2027 09/17/2017 0555  TROPONINI 0.03* 0.03* 0.24*   No results for input(s): TROPIPOC in the last 168 hours.  BNP Recent Labs  Lab 09/10/2017 0555  BNP 3,665.0*    DDimer No results for input(s): DDIMER in the last 168 hours.  Radiology/Studies:  Dg Chest Portable 1 View  Result Date: 08/30/2017 CLINICAL DATA:  76 year old male with chest pain and tachycardia. EXAM: PORTABLE CHEST 1 VIEW COMPARISON:  Chest abdomen and pelvis CTA 0618 hours today and earlier. FINDINGS: Portable AP upright view at 0628 hours. Sequelae of CABG. Stable cardiac size and mediastinal contours. Celsius CTA findings regarding abnormal thoracic aorta. Visualized tracheal air column is within normal limits. Small to moderate bilateral layering pleural effusions greater on the left. No pneumothorax, pulmonary edema, or other confluent pulmonary opacity.  IMPRESSION: 1. Moderate bilateral pleural effusions, better demonstrated on CTA today. 2. Thoracic and abdominal aortic aneurysms, see also CTA report. Electronically Signed   By: Genevie Ann M.D.   On: 09/05/2017 07:36   Ct Angio Chest/abd/pel For Dissection W And/or Wo Contrast  Result Date: 09/03/2017 CLINICAL DATA:  Unintentional weight loss.  Abdominal pain. EXAM: CT ANGIOGRAPHY CHEST, ABDOMEN AND PELVIS TECHNIQUE: Multidetector CT imaging through the chest, abdomen and pelvis was performed using the standard protocol during bolus administration of intravenous contrast. Multiplanar reconstructed images and MIPs were obtained and reviewed to evaluate the vascular anatomy. CONTRAST:  100 cc Isovue 370 IV COMPARISON:  Noncontrast Chest CT 6 days prior 09/09/2017 FINDINGS: CTA CHEST FINDINGS Cardiovascular: No aortic dissection, acute aortic syndrome or aortic hematoma. Fusiform aneurysmal dilatation of the ascending aorta maximal dimension 4.2 cm. Descending aorta is tortuous measure approximately 4.6 cm at the diaphragmatic hiatus. Moderate calcified and noncalcified atheromatous plaque throughout. Conventional branching pattern from the aortic arch. Mild cardiomegaly. Coronary artery calcifications post CABG. Possible thrombus in the left atrial appendage. No central pulmonary embolus. Mild reflux of contrast into the hepatic veins and IVC. Mediastinum/Nodes: No mediastinal or hilar adenopathy. Thickened distal esophagus with associated hiatal hernia. No thyroid nodule. Lungs/Pleura: Moderate bilateral pleural effusions with slight increased size from prior exam. Associated compressive atelectasis. Subpleural 6 mm nodule in the right middle lobe, unchanged. No confluent airspace disease. Breathing motion artifact limits more detailed assessment. Trachea and mainstem bronchi are patent. Musculoskeletal: Degenerative change in the spine. There are no acute or suspicious osseous abnormalities. Review of the MIP  images confirms the above findings. 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 Peripheral calcifications and circumferential mural thrombus. 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 Motion limits evaluation for adjacent soft tissue stranding, no evidence of associated retroperitoneal hemorrhage. No aortic dissection. Celiac: Patent with mild luminal narrowing at the origin. No dissection or vasculitis. SMA: Patent without evidence of aneurysm, dissection, vasculitis or significant stenosis. Renals: High-grade stenosis at the origin of the left renal artery. High-grade stenosis of the proximal right renal artery just distal to the origin. IMA: Occluded at the origin,  some reconstitution but small in caliber. Inflow: Left common iliac artery measures 19 mm, right common iliac artery measures 17 mm just distal to the iliac bifurcation. Moderate calcified and noncalcified atheromatous plaque. There is 50% stenosis of the right external iliac artery. High-grade stenosis of the right common femoral artery due to circumferential plaque. Visualized portion of the right SFA is small in caliber. High-grade stenosis of the left femoral artery, which is diminutive in caliber are were visualized. Probable occlusion of the proximal left superficial femoral artery in the lower most field of view. Veins: Not well assessed on this dedicated arterial study. Review of the MIP images confirms the above findings. NON-VASCULAR Hepatobiliary: No evidence of focal hepatic lesion allowing for arterial phase contrast and patient motion. Gallbladder is obscured by motion. Pancreas: No ductal dilatation or definite peripancreatic inflammation. Motion and lack of enteric contrast limits assessment for focal mass. Spleen: Normal in size. Adrenals/Urinary Tract: Left adrenal thickening. No definite right adrenal nodule. Left  renal atrophy, with slight decreased profusion compared to right. Simple cyst in the mid left kidney. Exophytic 2.4 cm lesion from the upper left kidney, nonspecific. Small cysts in the medial mid right kidney. No hydronephrosis. Urinary bladder is minimally distended. Question of bladder wall thickening. Stomach/Bowel: Limited bowel assessment given lack of enteric contrast, patient motion, and presence of intra-abdominal ascites. Stomach is nondistended. No evidence of bowel obstruction. Diverticulosis of the colon without evidence diverticulitis. Appendix obscured by motion. More detailed bowel evaluation is limited. Lymphatic: No bulky adenopathy, evaluation is limited. Reproductive: Prominent prostate gland. Other: Small volume abdominopelvic ascites. Diffuse body wall mesenteric edema suggest third-spacing. Musculoskeletal: Degenerative change in the lumbar spine. There are no acute or suspicious osseous abnormalities. Review of the MIP images confirms the above findings. IMPRESSION: 1. 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 No aortic dissection. No evidence of aortic rupture, however motion limited evaluation. Recommend followup by abdomen and pelvis CTA in 6 months, and vascular surgery referral/consultation if not already obtained. This recommendation follows ACR consensus guidelines: White Paper of the ACR Incidental Findings Committee II on Vascular Findings. J Am Coll Radiol 2013; 10:789-794. 2. Peripheral vascular disease with severe narrowing of bilateral common femoral arteries which are diminutive in caliber were visualized, diffuse calcified and noncalcified atheromatous plaque route the external iliac and common femoral arteries. Left superficial femoral artery is likely occluded in the lower most  portion of field of view. 3. Bilateral renal artery stenosis. Left renal atrophy, decreased profusion of the left kidney compared to right is likely chronic. 4. Possible thrombus in the left atrial appendage. Recommend echocardiogram. 5. Moderate bilateral pleural effusions, with slight increase from exam 6 days prior. 6. Abdominal evaluation limited by patient motion. Diverticulosis without convincing diverticulitis. 7. Small volume intra-abdominopelvic ascites. Body wall and mesenteric edema suggest third-spacing. 8. Indeterminate exophytic lesion from the upper left kidney, previously characterized as proteinaceous cyst on abdominal MRI, with slight increase size (currently 2.4 cm previously 1.8 cm). 9. Hiatal hernia with distal esophageal wall thickening. These results were called by telephone at the time of interpretation on 08/21/2017 at 7:13 am to Dr. Charlesetta Ivory , who verbally acknowledged these results. Electronically Signed   By: Jeb Levering M.D.   On: 08/20/2017 07:16   US Abdomen Limited Ruq  Result Date: 08/24/2017 CLINICAL DATA:  Upper abdominal pain. EXAM: ULTRASOUND ABDOMEN  LIMITED RIGHT UPPER QUADRANT COMPARISON:  MRI on 01/18/2008 FINDINGS: Gallbladder: No definite gallstones are seen. Diffuse gallbladder wall thickening is seen measuring up to 6 mm. No evidence of pericholecystic fluid. No sonographic Murphy sign noted by sonographer. Common bile duct: Diameter: 3 mm, within normal limits. Liver: No focal lesion identified. Within normal limits in parenchymal echogenicity. Mild perihepatic ascites is seen and subtle nodularity capsular contour of liver is seen, suspicious for hepatic cirrhosis. Portal vein is patent on color Doppler imaging with normal direction of blood flow towards the liver. Other: Small right pleural effusion. IMPRESSION: Diffuse gallbladder wall thickening, without definite gallstones or pericholecystic fluid. This finding is nonspecific and may be due to  hepatocellular disease. If clinically warranted, nuclear medicine hepatobiliary scan could be obtained for further evaluation. Mild perihepatic ascites, and possible hepatic cirrhosis. Suggest correlation with liver function tests, and consider hepatic elastography ultrasound. No evidence of biliary ductal dilatation. Small right pleural effusion incidentally noted. Electronically Signed   By: Earle Gell M.D.   On: 09/17/2017 08:33    Assessment and Plan:   1. Elevated troponin: -Family has denied any chest pain -Mildly elevated with uncertain significance at this time given his severe, acute illness with multiorgan failure -Continue to cycle until peak -Not a candidate for heparin gtt at this time given his multiorgan failure -Check echo  2. New onset Afib with RVR: -Of uncertain chronicity. Possibly in Afib with RVR on 11/22 in the ED -Given his acute illness, his rates are currently reasonably well controlled -There is no evidence of WCT on telemetry. The patient has Afib with RVR with nonspecific IVCD and artifact  -Not a candidate for heparin gtt given his multiorgan failure -BP and decompensated acute CHF preclude BB or CCB -ARF precludes digoxin -Continue to monitor  -Check magnesium -Not a candidate for rhythm control given inability to anticoagulate and uncertain chronicity -If becomes unstable, may need to perform emergent DCCV without anticoagulation   3. Multiorgan failure: -Uncertain etiology at this time -Defer workup to IM -His multiorgan failure does preclude invasive cardiac evaluation at this time as well as use of heparin for his elevated troponin and Afib -Check TSH, defer to IM  4. Failure to thrive/wegiht loss: -Has not been eating or drinking for the past 4 days -Concerning for underlying malignancy  -Per IM  5. Thrombocytopenia: -Acute -Uncertain etiology -Per IM  6. Leukocytosis: -Blood culture pending -Empiric ABX per IM  7. Hyperkalemia: -Given  kayexalate in the ED -Recommend rechecking, defer to IM  8. Acute CHF/elevated BNP/pleural effusions/bowel wall edema/thrid spacing: -Type unknown -Check echo as above -Diurese  -Hold on starting beta blockers at this time given his acute decompensation  9. Shock: -Likely multifactorial including septic and hypovolemic   -Maintain MAP > 65 mmHg -Per IM  10. ARF: -Possibly ATN in the setting of hypovolemia  -Per IM  11. Shock liver: -Uncertain etiology -Per IM  12. Encephalopathy: -Uncertain etiology -Consider imaging -Defer to IM  13. Dispo: -Guarded to poor prognosis -High risk for death   For questions or updates, please contact [place] Please consult [email or web address] for contact info under Cardiology/STEMI.   Signed, Christell Faith, PA-C [place] Pager: [phone] 08/20/2017, 10:38 AM

## 2017-09-15 NOTE — Consult Note (Signed)
Name: Jimmy Hopkins MRN: [id number] DOB: 1941/08/15    ADMISSION DATE:  07-30-17 CONSULTATION DATE: 07-30-17  REFERRING MD : Dr. Sherryll BurgerShah   CHIEF COMPLAINT:  Abdominal Pain   BRIEF PATIENT DESCRIPTION:  76 yo male admitted with abdominal pain with elevated LFT's, acute metabolic encephalopathy, multiorgan failure, new onset afib with rvr, acute CHF, and failure to thrive/weight loss   SIGNIFICANT EVENTS  11/28-Pt admitted to ICU   STUDIES:  CT Angio Chest Angio 11/28>>Diffuse calcified and noncalcified atheromatous plaque of the thoracoabdominal aorta with ascending thoracic aorta and abdominal aortic aneurysms. 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 No aortic dissection. No evidence of aortic rupture, however motion limited evaluation. Recommend followup by abdomen and pelvis CTA in 6 months, and vascular surgery referral/consultation if not already obtained. This recommendation follows ACR consensus guidelines: White Paper of the ACR Incidental Findings Committee II on Vascular Findings. J Am Coll Radiol 2013; 10:789-794. Peripheral vascular disease with severe narrowing of bilateral common femoral arteries which are diminutive in caliber were visualized, diffuse calcified and noncalcified atheromatous plaque route the external iliac and common femoral arteries. Left superficial femoral artery is likely occluded in the lower most portion of field of view. Bilateral renal artery stenosis. Left renal atrophy, decreased profusion of the left kidney compared to right is likely chronic. Possible thrombus in the left atrial appendage. Recommend echocardiogram. Moderate bilateral pleural effusions, with slight increase from exam 6 days prior. Abdominal evaluation limited by patient motion. Diverticulosis without convincing diverticulitis. Small volume intra-abdominopelvic ascites. Body wall and mesenteric edema suggest third-spacing.  Indeterminate exophytic lesion from the upper left kidney, previously characterized as proteinaceous cyst on abdominal MRI, with slight increase size (currently 2.4 cm previously 1.8 cm). Hiatal hernia with distal esophageal wall thickening. US Abd Limited RUQ 11/28>>Diffuse gallbladder wall thickening, without definite gallstones or pericholecystic fluid. This finding is nonspecific and may be due to hepatocellular disease. If clinically warranted, nuclear medicine hepatobiliary scan could be obtained for further evaluation. Mild perihepatic ascites, and possible hepatic cirrhosis. Suggest correlation with liver function tests, and consider hepatic elastography ultrasound. No evidence of biliary ductal dilatation. Small right pleural effusion incidentally noted.  HISTORY OF PRESENT ILLNESS:   This is a 76 yo male with a PMH of CAD s/p [facility]04[facility], ADHD, and has not followed up with a physician in several years. He presented to [facility] ER with c/o abdominal pain and diarrhea x3-4 days and 50 pound weight loss over the past 2 months.  He has also been unable to eat solid foods for 4 days prior to presentation to the ER, although he has been able to drink some liquids.  In the ER lab results revealed the pt was hyponatremic, in acute renal failure with hyperkalemia, troponin 0.24, lactic acid 5.5, and LFT's were elevated.  CT Abd Pelvis revealed 4.7 cm AAA, however without aortic dissection.  In the ER he was given iv lasix and iv abx.  He was subsequently admitted to ICU by hospitalist team for further workup and treatment PCCM consulted.   PAST MEDICAL HISTORY :   has a past medical history of ADHD and CAD (coronary artery disease).  has a past surgical history that includes Cardiac surgery and Hernia repair. Prior to Admission medications   Medication Sig Start Date End Date Taking? Authorizing Provider  albuterol (PROVENTIL HFA;VENTOLIN HFA) 108 (90 Base) MCG/ACT inhaler Inhale 2 puffs into  the lungs every 6 (six) hours as  needed for wheezing or shortness of breath. 09/09/17   Dionne Bucy, MD  methylphenidate (RITALIN) 10 MG tablet Take 1 tablet by mouth 2 (two) times daily.    [provider]  methylphenidate (RITALIN) 20 MG tablet Take 1 tablet by mouth 3 (three) times daily. 06/16/17   [provider]  potassium chloride 20 MEQ TBCR Take 20 mEq by mouth 2 (two) times daily for 14 days. Patient not taking: Reported on 08/27/2017 09/10/17 09/24/17  Dionne Bucy, MD  predniSONE (DELTASONE) 20 MG tablet Take 3 tablets (60 mg total) by mouth daily for 5 days. Patient not taking: Reported on 09/08/2017 09/10/17 09/05/2017  Dionne Bucy, MD   Allergies  Allergen Reactions  . Tetracyclines & Related Hives    FAMILY HISTORY:  family history includes Heart disease in his brother, father, and mother. SOCIAL HISTORY:  reports that he has been smoking cigarettes.  He has been smoking about 1.00 pack per day. He does not have any smokeless tobacco history on file.  REVIEW OF SYSTEMS: Positives in BOLD  Constitutional: Negative for fever, chills, weight loss, malaise/fatigue and diaphoresis.  HENT: Negative for hearing loss, ear pain, nosebleeds, congestion, sore throat, neck pain, tinnitus and ear discharge.   Eyes: Negative for blurred vision, double vision, photophobia, pain, discharge and redness.  Respiratory: Negative for cough, hemoptysis, sputum production, shortness of breath, wheezing and stridor.   Cardiovascular: Negative for chest pain, palpitations, orthopnea, claudication, leg swelling and PND.  Gastrointestinal: heartburn, nausea, vomiting, abdominal pain, diarrhea, constipation, blood in stool and melena.  Genitourinary: Negative for dysuria, urgency, frequency, hematuria and flank pain.  Musculoskeletal: Negative for myalgias, back pain, joint pain and falls.  Skin: Negative for itching and rash.  Neurological: Negative for  dizziness, tingling, tremors, sensory change, speech change, focal weakness, seizures, loss of consciousness, weakness and headaches.  Endo/Heme/Allergies: Negative for environmental allergies and polydipsia. Does not bruise/bleed easily.  SUBJECTIVE:  Pt agitated feels like he has to urinate.   VITAL SIGNS: Temp:  [96.1 F (35.6 C)-97.5 F (36.4 C)] 96.1 F (35.6 C) (11/28 1115) Pulse Rate:  [53-126] 81 (11/28 1121) Resp:  [12-32] 19 (11/28 1121) BP: (89-138)/(64-101) 107/82 (11/28 1115) SpO2:  [93 %-100 %] 100 % (11/28 1121) Weight:  [68 kg (150 lb)] 68 kg (150 lb) (11/28 0557)  PHYSICAL EXAMINATION: General: frail elderly male slightly agitated, NAD  Neuro: confused, follows commands, PERRLA, scleral icterus  HEENT: supple, no JVD  Cardiovascular: irregular irregular, rate controlled, no M/R/G  Lungs: diminished throughout, even, mildly labored  Abdomen: +BS x4, soft, tender throughout, non distended  Musculoskeletal: normal bulk and tone, no edema  Skin: bilateral lower extremities cool to touch  Recent Labs  Lab 09/09/17 1654 08/28/2017 0555  NA 136 128*  K 2.8* 6.9*  CL 104 99*  CO2 21* 16*  BUN 16 62*  CREATININE 1.50* 2.89*  GLUCOSE 108* 58*   Recent Labs  Lab 09/09/17 1654 09/06/2017 0555  HGB 12.1* 11.1*  HCT 36.6* 34.7*  WBC 5.2 11.6*  PLT 169 65*   Dg Chest Portable 1 View  Result Date: 08/20/2017 CLINICAL DATA:  76 year old male with chest pain and tachycardia. EXAM: PORTABLE CHEST 1 VIEW COMPARISON:  Chest abdomen and pelvis CTA 0618 hours today and earlier. FINDINGS: Portable AP upright view at 0628 hours. Sequelae of CABG. Stable cardiac size and mediastinal contours. Celsius CTA findings regarding abnormal thoracic aorta. Visualized tracheal air column is within normal limits. Small to moderate bilateral layering pleural effusions  greater on the left. No pneumothorax, pulmonary edema, or other confluent pulmonary opacity. IMPRESSION: 1. Moderate  bilateral pleural effusions, better demonstrated on CTA today. 2. Thoracic and abdominal aortic aneurysms, see also CTA report. Electronically Signed   By: Odessa Fleming M.D.   On: 08/25/2017 07:36   Ct Angio Chest/abd/pel For Dissection W And/or Wo Contrast  Result Date: 08/22/2017 CLINICAL DATA:  Unintentional weight loss.  Abdominal pain. EXAM: CT ANGIOGRAPHY CHEST, ABDOMEN AND PELVIS TECHNIQUE: Multidetector CT imaging through the chest, abdomen and pelvis was performed using the standard protocol during bolus administration of intravenous contrast. Multiplanar reconstructed images and MIPs were obtained and reviewed to evaluate the vascular anatomy. CONTRAST:  100 cc Isovue 370 IV COMPARISON:  Noncontrast Chest CT 6 days prior 09/09/2017 FINDINGS: CTA CHEST FINDINGS Cardiovascular: No aortic dissection, acute aortic syndrome or aortic hematoma. Fusiform aneurysmal dilatation of the ascending aorta maximal dimension 4.2 cm. Descending aorta is tortuous measure approximately 4.6 cm at the diaphragmatic hiatus. Moderate calcified and noncalcified atheromatous plaque throughout. Conventional branching pattern from the aortic arch. Mild cardiomegaly. Coronary artery calcifications post CABG. Possible thrombus in the left atrial appendage. No central pulmonary embolus. Mild reflux of contrast into the hepatic veins and IVC. Mediastinum/Nodes: No mediastinal or hilar adenopathy. Thickened distal esophagus with associated hiatal hernia. No thyroid nodule. Lungs/Pleura: Moderate bilateral pleural effusions with slight increased size from prior exam. Associated compressive atelectasis. Subpleural 6 mm nodule in the right middle lobe, unchanged. No confluent airspace disease. Breathing motion artifact limits more detailed assessment. Trachea and mainstem bronchi are patent. Musculoskeletal: Degenerative change in the spine. There are no acute or suspicious osseous abnormalities. Review of the MIP images confirms the above  findings. 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 Peripheral calcifications and circumferential mural thrombus. 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 Motion limits evaluation for adjacent soft tissue stranding, no evidence of associated retroperitoneal hemorrhage. No aortic dissection. Celiac: Patent with mild luminal narrowing at the origin. No dissection or vasculitis. SMA: Patent without evidence of aneurysm, dissection, vasculitis or significant stenosis. Renals: High-grade stenosis at the origin of the left renal artery. High-grade stenosis of the proximal right renal artery just distal to the origin. IMA: Occluded at the origin, some reconstitution but small in caliber. Inflow: Left common iliac artery measures 19 mm, right common iliac artery measures 17 mm just distal to the iliac bifurcation. Moderate calcified and noncalcified atheromatous plaque. There is 50% stenosis of the right external iliac artery. High-grade stenosis of the right common femoral artery due to circumferential plaque. Visualized portion of the right SFA is small in caliber. High-grade stenosis of the left femoral artery, which is diminutive in caliber are were visualized. Probable occlusion of the proximal left superficial femoral artery in the lower most field of view. Veins: Not well assessed on this dedicated arterial study. Review of the MIP images confirms the above findings. NON-VASCULAR Hepatobiliary: No evidence of focal hepatic lesion allowing for arterial phase contrast and patient motion. Gallbladder is obscured by motion. Pancreas: No ductal dilatation or definite peripancreatic inflammation. Motion and lack of enteric contrast limits assessment for focal mass. Spleen: Normal in size. Adrenals/Urinary Tract: Left adrenal thickening. No definite right adrenal nodule. Left renal atrophy, with slight  decreased profusion compared to right. Simple cyst in the mid left kidney. Exophytic 2.4 cm lesion from the upper left kidney, nonspecific. Small cysts in the  medial mid right kidney. No hydronephrosis. Urinary bladder is minimally distended. Question of bladder wall thickening. Stomach/Bowel: Limited bowel assessment given lack of enteric contrast, patient motion, and presence of intra-abdominal ascites. Stomach is nondistended. No evidence of bowel obstruction. Diverticulosis of the colon without evidence diverticulitis. Appendix obscured by motion. More detailed bowel evaluation is limited. Lymphatic: No bulky adenopathy, evaluation is limited. Reproductive: Prominent prostate gland. Other: Small volume abdominopelvic ascites. Diffuse body wall mesenteric edema suggest third-spacing. Musculoskeletal: Degenerative change in the lumbar spine. There are no acute or suspicious osseous abnormalities. Review of the MIP images confirms the above findings. IMPRESSION: 1. 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 No aortic dissection. No evidence of aortic rupture, however motion limited evaluation. Recommend followup by abdomen and pelvis CTA in 6 months, and vascular surgery referral/consultation if not already obtained. This recommendation follows ACR consensus guidelines: White Paper of the ACR Incidental Findings Committee II on Vascular Findings. J Am Coll Radiol 2013; 10:789-794. 2. Peripheral vascular disease with severe narrowing of bilateral common femoral arteries which are diminutive in caliber were visualized, diffuse calcified and noncalcified atheromatous plaque route the external iliac and common femoral arteries. Left superficial femoral artery is likely occluded in the lower most portion of field of view. 3.  Bilateral renal artery stenosis. Left renal atrophy, decreased profusion of the left kidney compared to right is likely chronic. 4. Possible thrombus in the left atrial appendage. Recommend echocardiogram. 5. Moderate bilateral pleural effusions, with slight increase from exam 6 days prior. 6. Abdominal evaluation limited by patient motion. Diverticulosis without convincing diverticulitis. 7. Small volume intra-abdominopelvic ascites. Body wall and mesenteric edema suggest third-spacing. 8. Indeterminate exophytic lesion from the upper left kidney, previously characterized as proteinaceous cyst on abdominal MRI, with slight increase size (currently 2.4 cm previously 1.8 cm). 9. Hiatal hernia with distal esophageal wall thickening. These results were called by telephone at the time of interpretation on 12-Jul-2017 at 7:13 am to Dr. Lucrezia EuropeALLISON WEBSTER , who verbally acknowledged these results. Electronically Signed   By: Rubye OaksMelanie  Ehinger M.D.   On: 12-Jul-2017 07:16   [place] Abdomen Limited Ruq  Result Date: 12-Jul-2017 CLINICAL DATA:  Upper abdominal pain. EXAM: ULTRASOUND ABDOMEN LIMITED RIGHT UPPER QUADRANT COMPARISON:  MRI on 01/18/2008 FINDINGS: Gallbladder: No definite gallstones are seen. Diffuse gallbladder wall thickening is seen measuring up to 6 mm. No evidence of pericholecystic fluid. No sonographic Murphy sign noted by sonographer. Common bile duct: Diameter: 3 mm, within normal limits. Liver: No focal lesion identified. Within normal limits in parenchymal echogenicity. Mild perihepatic ascites is seen and subtle nodularity capsular contour of liver is seen, suspicious for hepatic cirrhosis. Portal vein is patent on color Doppler imaging with normal direction of blood flow towards the liver. Other: Small right pleural effusion. IMPRESSION: Diffuse gallbladder wall thickening, without definite gallstones or pericholecystic fluid. This finding is nonspecific and may be due to hepatocellular disease. If  clinically warranted, nuclear medicine hepatobiliary scan could be obtained for further evaluation. Mild perihepatic ascites, and possible hepatic cirrhosis. Suggest correlation with liver function tests, and consider hepatic elastography ultrasound. No evidence of biliary ductal dilatation. Small right pleural effusion incidentally noted. Electronically Signed   By: Myles RosenthalJohn  Stahl M.D.   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  Acute on chronic renal failure with hyperkalemia Lactic acidosis Elevated troponin's likely secondary to demand ischemia  New Onset Afib with RVR-currently rate controlled  Acute Thrombocytopenia Failure to Thrive/Weight Loss-concerning for possible malignancy   P: Supplemental O2 for dyspnea and/or to maintain O2 sats >92% Prn CXR  Additional IV lasix  Cardiology, Vascular, and Nephrology consulted appreciate input Sodium Bicarb gtt @50  ml/hr Repeat BMP @14 :00 may require dialysis will defer to nephrology  Replace electrolytes as indicated Monitor UOP Trend WBC and monitor fever curve  Follow cultures Will discontinue vancomycin and continue zosyn for now PCT pending Trend lactic acid Trend troponin's Continuous telemetry monitoring Maintain map >65 Subq heparin for VTE prophylaxis if platelet count continues to drop will discontinue Trend CBC Monitor for s/sx of bleeding  Transfuse for hgb <7 Will consult dietitian  Prn morphine for pain management   Sonda Rumble, AGNP  Pulmonary/Critical Care Pager [phone] (please enter 7 digits) PCCM Consult Pager [phone] (please enter 7 digits)

## 2017-09-15 NOTE — ED Provider Notes (Signed)
[facility] Emergency Department Provider Note   ____________________________________________   First MD Initiated Contact with Patient Sep 27, 2017 0600     (approximate)  I have reviewed the triage vital signs and the nursing notes.   HISTORY  Chief Complaint Abdominal Pain    HPI Jimmy Hopkins is a 76 y.o. male who comes into the hospital today with some abdominal pain.  According to the patient's family he has been losing a lot of weight and has been having some diarrhea.  They state that his abdominal pain has been unable to be controlled.  He had an appointment today to see his primary care doctor but they did not think that they could get him there.  They asked if he would come to the hospital and he stated yes.  The patient has lost 50 pounds in the past 2 months and has been gagging and vomiting.  He has not been eating and has not had any solid food since Saturday which was approximately 4 days ago.  The patient did drink some liquids yesterday but the patient has family is concerned.  They report that he lives alone and has not been very forthcoming about his health information to his sisters.  The patient came into stating that his stomach hurts.  He is here today for evaluation.  The patient denies any chest pain or shortness of breath but he is breathing fast.   Past Medical History:  Diagnosis Date  . ADHD     There are no active problems to display for this patient.   Past Surgical History:  Procedure Laterality Date  . CARDIAC SURGERY    . HERNIA REPAIR      Prior to Admission medications   Medication Sig Start Date End Date Taking? Authorizing Provider  albuterol (PROVENTIL HFA;VENTOLIN HFA) 108 (90 Base) MCG/ACT inhaler Inhale 2 puffs into the lungs every 6 (six) hours as needed for wheezing or shortness of breath. 09/09/17   Dionne Bucy, MD  methylphenidate (RITALIN) 10 MG tablet Take 1 tablet by mouth 2 (two) times daily.     [provider]  methylphenidate (RITALIN) 20 MG tablet Take 1 tablet by mouth 3 (three) times daily. 06/16/17   [provider]  potassium chloride 20 MEQ TBCR Take 20 mEq by mouth 2 (two) times daily for 14 days. Patient not taking: Reported on 2017/09/27 09/10/17 09/24/17  Dionne Bucy, MD  predniSONE (DELTASONE) 20 MG tablet Take 3 tablets (60 mg total) by mouth daily for 5 days. Patient not taking: Reported on 2017/09/27 09/10/17 09/27/17  Dionne Bucy, MD    Allergies Tetracyclines & related  No family history on file.  Social History Social History   Tobacco Use  . Smoking status: Current Every Day Smoker    Packs/day: 1.00    Types: Cigarettes  Substance Use Topics  . Alcohol use: Not on file  . Drug use: Not on file    Review of Systems  Constitutional: No fever/chills Eyes: No visual changes. ENT: No sore throat. Cardiovascular: Denies chest pain. Respiratory: Denies shortness of breath. Gastrointestinal:  abdominal pain.  nausea,  vomiting.   diarrhea.  No constipation. Genitourinary: Negative for dysuria. Musculoskeletal: Negative for back pain. Skin: Negative for rash. Neurological: Negative for headaches, focal weakness or numbness.   ____________________________________________   PHYSICAL EXAM:  VITAL SIGNS: ED Triage Vitals  Enc Vitals Group     BP 09/27/17 0557 125/89     Pulse Rate 27-Sep-2017 0557 Marland Kitchen)  122     Resp 04/29/17 0557 (!) 32     Temp 04/29/17 0557 (!) 97.5 F (36.4 C)     Temp Source 04/29/17 0557 Oral     SpO2 04/29/17 0557 97 %     Weight 04/29/17 0557 150 lb (68 kg)     Height 04/29/17 0557 6' (1.829 m)     Head Circumference --      Peak Flow --      Pain Score 04/29/17 0556 10     Pain Loc --      Pain Edu? --      Excl. in GC? --     Constitutional: Alert some mild confusion, Ill appearing and in moderate distress. Eyes: Scleral icterus present. PERRL. EOMI. Head: Atraumatic. Nose: No  congestion/rhinnorhea. Mouth/Throat: Mucous membranes are moist.  Oropharynx non-erythematous. Cardiovascular: Tachycaria, irregular rhythm. Grossly normal heart sounds.  Good peripheral circulation. Respiratory: Tachypnea.  No retractions. Lungs CTAB. Gastrointestinal: Soft some diffuse tenderness to palpation. No distention.  Positive bowel sounds Musculoskeletal: No lower extremity tenderness nor edema.   Neurologic:  Normal speech and language.  Patient with some episodes of staring off but he does answer some questions.  Slow to respond with answering questions Skin:  Skin is warm, dry and intact.  Psychiatric: Mood and affect are normal.   ____________________________________________   LABS (all labs ordered are listed, but only abnormal results are displayed)  Labs Reviewed  LIPASE, BLOOD - Abnormal; Notable for the following components:      Result Value   Lipase 52 (*)    All other components within normal limits  COMPREHENSIVE METABOLIC PANEL - Abnormal; Notable for the following components:   Sodium 128 (*)    Potassium 6.9 (*)    Chloride 99 (*)    CO2 16 (*)    Glucose, Bld 58 (*)    BUN 62 (*)    Creatinine, Ser 2.89 (*)    AST 2,085 (*)    ALT 1,777 (*)    Alkaline Phosphatase 140 (*)    Total Bilirubin 3.4 (*)    GFR calc non Af Amer 20 (*)    GFR calc Af Amer 23 (*)    All other components within normal limits  CBC - Abnormal; Notable for the following components:   WBC 11.6 (*)    RBC 3.89 (*)    Hemoglobin 11.1 (*)    HCT 34.7 (*)    RDW 19.6 (*)    Platelets 65 (*)    All other components within normal limits  URINALYSIS, COMPLETE (UACMP) WITH MICROSCOPIC - Abnormal; Notable for the following components:   Color, Urine YELLOW (*)    APPearance CLEAR (*)    Squamous Epithelial / LPF 0-5 (*)    All other components within normal limits  TROPONIN I - Abnormal; Notable for the following components:   Troponin I 0.24 (*)    All other components within  normal limits  LACTIC ACID, PLASMA - Abnormal; Notable for the following components:   Lactic Acid, Venous 5.5 (*)    All other components within normal limits  CULTURE, BLOOD (ROUTINE X 2)  CULTURE, BLOOD (ROUTINE X 2)  LACTIC ACID, PLASMA  BRAIN NATRIURETIC PEPTIDE  CBG MONITORING, ED  TYPE AND SCREEN   ____________________________________________  EKG  ED ECG REPORT I, Rebecka ApleyWebster,  Mckenzie Bove P, the attending physician, personally viewed and interpreted this ECG.   Date: Jun 19, 2017  EKG Time: 555  Rate: 121  Rhythm:  atrial fibrillation, rate 121  Axis: left axis deviation  Intervals:left bundle branch block  ST&T Change: LBBB  ED ECG REPORT #2 I, Rebecka Apley, the attending physician, personally viewed and interpreted this ECG.   Date: 09/10/2017  EKG Time: 713  Rate: 105  Rhythm: atrial fibrillation, rate 105  Axis: left axis deviation  Intervals:none  ST&T Change: ST depression in leads V4, V5 and V6.  ____________________________________________  RADIOLOGY  Dg Chest Portable 1 View  Result Date: 09/16/2017 CLINICAL DATA:  76 year old male with chest pain and tachycardia. EXAM: PORTABLE CHEST 1 VIEW COMPARISON:  Chest abdomen and pelvis CTA 0618 hours today and earlier. FINDINGS: Portable AP upright view at 0628 hours. Sequelae of CABG. Stable cardiac size and mediastinal contours. Celsius CTA findings regarding abnormal thoracic aorta. Visualized tracheal air column is within normal limits. Small to moderate bilateral layering pleural effusions greater on the left. No pneumothorax, pulmonary edema, or other confluent pulmonary opacity. IMPRESSION: 1. Moderate bilateral pleural effusions, better demonstrated on CTA today. 2. Thoracic and abdominal aortic aneurysms, see also CTA report. Electronically Signed   By: Odessa Fleming M.D.   On: 08/27/2017 07:36   Ct Angio Chest/abd/pel For Dissection W And/or Wo Contrast  Result Date: 09/12/2017 CLINICAL DATA:  Unintentional  weight loss.  Abdominal pain. EXAM: CT ANGIOGRAPHY CHEST, ABDOMEN AND PELVIS TECHNIQUE: Multidetector CT imaging through the chest, abdomen and pelvis was performed using the standard protocol during bolus administration of intravenous contrast. Multiplanar reconstructed images and MIPs were obtained and reviewed to evaluate the vascular anatomy. CONTRAST:  100 cc Isovue 370 IV COMPARISON:  Noncontrast Chest CT 6 days prior 09/09/2017 FINDINGS: CTA CHEST FINDINGS Cardiovascular: No aortic dissection, acute aortic syndrome or aortic hematoma. Fusiform aneurysmal dilatation of the ascending aorta maximal dimension 4.2 cm. Descending aorta is tortuous measure approximately 4.6 cm at the diaphragmatic hiatus. Moderate calcified and noncalcified atheromatous plaque throughout. Conventional branching pattern from the aortic arch. Mild cardiomegaly. Coronary artery calcifications post CABG. Possible thrombus in the left atrial appendage. No central pulmonary embolus. Mild reflux of contrast into the hepatic veins and IVC. Mediastinum/Nodes: No mediastinal or hilar adenopathy. Thickened distal esophagus with associated hiatal hernia. No thyroid nodule. Lungs/Pleura: Moderate bilateral pleural effusions with slight increased size from prior exam. Associated compressive atelectasis. Subpleural 6 mm nodule in the right middle lobe, unchanged. No confluent airspace disease. Breathing motion artifact limits more detailed assessment. Trachea and mainstem bronchi are patent. Musculoskeletal: Degenerative change in the spine. There are no acute or suspicious osseous abnormalities. Review of the MIP images confirms the above findings. 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 Peripheral calcifications and circumferential mural thrombus. 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 Motion limits evaluation for adjacent  soft tissue stranding, no evidence of associated retroperitoneal hemorrhage. No aortic dissection. Celiac: Patent with mild luminal narrowing at the origin. No dissection or vasculitis. SMA: Patent without evidence of aneurysm, dissection, vasculitis or significant stenosis. Renals: High-grade stenosis at the origin of the left renal artery. High-grade stenosis of the proximal right renal artery just distal to the origin. IMA: Occluded at the origin, some reconstitution but small in caliber. Inflow: Left common iliac artery measures 19 mm, right common iliac artery measures 17 mm just distal to the iliac bifurcation. Moderate calcified and noncalcified atheromatous plaque. There is 50% stenosis of the right external iliac artery. High-grade stenosis of the right  common femoral artery due to circumferential plaque. Visualized portion of the right SFA is small in caliber. High-grade stenosis of the left femoral artery, which is diminutive in caliber are were visualized. Probable occlusion of the proximal left superficial femoral artery in the lower most field of view. Veins: Not well assessed on this dedicated arterial study. Review of the MIP images confirms the above findings. NON-VASCULAR Hepatobiliary: No evidence of focal hepatic lesion allowing for arterial phase contrast and patient motion. Gallbladder is obscured by motion. Pancreas: No ductal dilatation or definite peripancreatic inflammation. Motion and lack of enteric contrast limits assessment for focal mass. Spleen: Normal in size. Adrenals/Urinary Tract: Left adrenal thickening. No definite right adrenal nodule. Left renal atrophy, with slight decreased profusion compared to right. Simple cyst in the mid left kidney. Exophytic 2.4 cm lesion from the upper left kidney, nonspecific. Small cysts in the medial mid right kidney. No hydronephrosis. Urinary bladder is minimally distended. Question of bladder wall thickening. Stomach/Bowel: Limited bowel  assessment given lack of enteric contrast, patient motion, and presence of intra-abdominal ascites. Stomach is nondistended. No evidence of bowel obstruction. Diverticulosis of the colon without evidence diverticulitis. Appendix obscured by motion. More detailed bowel evaluation is limited. Lymphatic: No bulky adenopathy, evaluation is limited. Reproductive: Prominent prostate gland. Other: Small volume abdominopelvic ascites. Diffuse body wall mesenteric edema suggest third-spacing. Musculoskeletal: Degenerative change in the lumbar spine. There are no acute or suspicious osseous abnormalities. Review of the MIP images confirms the above findings. IMPRESSION: 1. 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 No aortic dissection. No evidence of aortic rupture, however motion limited evaluation. Recommend followup by abdomen and pelvis CTA in 6 months, and vascular surgery referral/consultation if not already obtained. This recommendation follows ACR consensus guidelines: White Paper of the ACR Incidental Findings Committee II on Vascular Findings. J Am Coll Radiol 2013; 10:789-794. 2. Peripheral vascular disease with severe narrowing of bilateral common femoral arteries which are diminutive in caliber were visualized, diffuse calcified and noncalcified atheromatous plaque route the external iliac and common femoral arteries. Left superficial femoral artery is likely occluded in the lower most portion of field of view. 3. Bilateral renal artery stenosis. Left renal atrophy, decreased profusion of the left kidney compared to right is likely chronic. 4. Possible thrombus in the left atrial appendage. Recommend echocardiogram. 5. Moderate bilateral pleural effusions, with slight increase from exam 6 days prior. 6. Abdominal  evaluation limited by patient motion. Diverticulosis without convincing diverticulitis. 7. Small volume intra-abdominopelvic ascites. Body wall and mesenteric edema suggest third-spacing. 8. Indeterminate exophytic lesion from the upper left kidney, previously characterized as proteinaceous cyst on abdominal MRI, with slight increase size (currently 2.4 cm previously 1.8 cm). 9. Hiatal hernia with distal esophageal wall thickening. These results were called by telephone at the time of interpretation on 09/09/2017 at 7:13 am to Dr. Lucrezia Europe , who verbally acknowledged these results. Electronically Signed   By: Rubye Oaks M.D.   On: 09/07/2017 07:16   US Abdomen Limited Ruq  Result Date: 09/03/2017 CLINICAL DATA:  Upper abdominal pain. EXAM: ULTRASOUND ABDOMEN LIMITED RIGHT UPPER QUADRANT COMPARISON:  MRI on 01/18/2008 FINDINGS: Gallbladder: No definite gallstones are seen. Diffuse gallbladder wall thickening is seen measuring up to 6 mm. No evidence of pericholecystic fluid. No sonographic Murphy sign noted by sonographer. Common bile duct: Diameter: 3 mm, within normal limits.  Liver: No focal lesion identified. Within normal limits in parenchymal echogenicity. Mild perihepatic ascites is seen and subtle nodularity capsular contour of liver is seen, suspicious for hepatic cirrhosis. Portal vein is patent on color Doppler imaging with normal direction of blood flow towards the liver. Other: Small right pleural effusion. IMPRESSION: Diffuse gallbladder wall thickening, without definite gallstones or pericholecystic fluid. This finding is nonspecific and may be due to hepatocellular disease. If clinically warranted, nuclear medicine hepatobiliary scan could be obtained for further evaluation. Mild perihepatic ascites, and possible hepatic cirrhosis. Suggest correlation with liver function tests, and consider hepatic elastography ultrasound. No evidence of biliary ductal dilatation. Small right pleural  effusion incidentally noted. Electronically Signed   By: Myles Rosenthal M.D.   On: 09/16/2017 08:33    ____________________________________________   PROCEDURES  Procedure(s) performed: None  .Critical Care Performed by: Rebecka Apley, MD Authorized by: Rebecka Apley, MD   Critical care provider statement:    Critical care time (minutes):  75   Critical care start time:  09/14/2017 6:00 AM   Critical care end time:  08/30/2017 7:15 AM   Critical care time was exclusive of:  Separately billable procedures and treating other patients   Critical care was necessary to treat or prevent imminent or life-threatening deterioration of the following conditions:  Hepatic failure, metabolic crisis, dehydration, sepsis and renal failure   Critical care was time spent personally by me on the following activities:  Development of treatment plan with patient or surrogate, discussions with consultants, evaluation of patient's response to treatment, examination of patient, obtaining history from patient or surrogate, ordering and performing treatments and interventions, ordering and review of laboratory studies, ordering and review of radiographic studies, pulse oximetry, re-evaluation of patient's condition and review of old charts    Critical Care performed: Yes, see critical care note(s)  ____________________________________________   INITIAL IMPRESSION / ASSESSMENT AND PLAN / ED COURSE  As part of my medical decision making, I reviewed the following data within the electronic MEDICAL RECORD NUMBER Notes from prior ED visits and [place] Controlled Substance Database  This is a 76 year old male who comes into the hospital today with abdominal pain.  I was called into the room shortly after the patient arrived due to his coloring in his appearance.  The patient is pale in color and icteric.  The patient also had a wide-complex irregular rhythm that was variable in rate but tachycardic.  I initially  viewed the patient's EKG in his previous visit.  The patient had been here and had been diagnosed with some hyponatremia and dehydration.  I gave the patient a dose of magnesium sulfate since he did have a prolonged QTC.  The patient did have abdominal pain so I placed an ultrasound probe on his abdomen and visualized and abdominal aortic aneurysm.  I then sent the patient for an immediate CT of his chest abdomen pelvis looking for dissection versus aneurysm.  The patient also had a thoracic aortic aneurysm on his previous CT.  I did start to receive results of the patient's blood work and it was found that the patient was per [facility] make with a potassium of 6.9.  The patient then received a dose of calcium gluconate, insulin, dextrose, albuterol, sodium bicarbonate and lasix.  The patient CT scan showed an aneurysm but it was not ruptured.  The patient has some narrowing of his common femoral vessel caliber.  He did start developing some toe pain and duskiness to his toes.  I contacted the vascular surgeon Dr. Gilda CreaseSchnier but he did not feel that this was his primary condition.  He felt that the patient was too unstable for intervention at this time and given the patient's thrombocytopenia we did not feel it appropriate for the patient to be started on heparin.  I also sent the patient for an ultrasound looking for the reason for his elevated liver enzymes.  The patient's ultrasound showed some gallbladder thickening but no other findings.  Given the lack of findings on the CT scan I did treat the patient for possible sepsis.  He received a dose of Zosyn and vancomycin given his intra-abdominal pain.  The patient did receive a dose of fentanyl for his pain as well and he received approximately 3 L of normal saline IV.  The patient's troponin is also elevated.  Given the patient's complex presentation and results he will be admitted to the hospitalist service for further evaluation of his symptoms.       ____________________________________________   FINAL CLINICAL IMPRESSION(S) / ED DIAGNOSES  Final diagnoses:  Abdominal pain  Hyperkalemia  Acute renal failure, unspecified acute renal failure type (HCC)  Acute liver failure without hepatic coma  Abdominal aortic aneurysm (AAA) without rupture (HCC)  Sepsis, due to unspecified organism [facility](HCC)  Peripheral vascular disease (HCC)  Thoracic aortic aneurysm without rupture [facility](HCC)     ED Discharge Orders    None       Note:  This document was prepared using Dragon voice recognition software and may include unintentional dictation errors.    Rebecka ApleyWebster, Sourish Allender P, MD April 07, 2017 [phone]

## 2017-09-15 NOTE — ED Triage Notes (Signed)
Pt arrived via ems from home where he lives with his sister. EMS was called by sister due to abdominal pain and lack of appetite. EMS report from sister that pt hasn't eaton in 3 days. Upon initial assessment pt alert.

## 2017-09-15 NOTE — Progress Notes (Signed)
  Echocardiogram 2D Echocardiogram has been performed.  Jimmy SavoyCasey Hopkins Jimmy Hopkins 07-05-2017, 3:31 PM

## 2017-09-16 ENCOUNTER — Inpatient Hospital Stay: Payer: Medicare Other

## 2017-09-16 DIAGNOSIS — Z66 Do not resuscitate: Secondary | ICD-10-CM

## 2017-09-16 DIAGNOSIS — R410 Disorientation, unspecified: Secondary | ICD-10-CM

## 2017-09-16 DIAGNOSIS — R1084 Generalized abdominal pain: Secondary | ICD-10-CM

## 2017-09-16 DIAGNOSIS — I739 Peripheral vascular disease, unspecified: Secondary | ICD-10-CM

## 2017-09-16 DIAGNOSIS — I420 Dilated cardiomyopathy: Secondary | ICD-10-CM

## 2017-09-16 DIAGNOSIS — Z515 Encounter for palliative care: Secondary | ICD-10-CM

## 2017-09-16 DIAGNOSIS — R1010 Upper abdominal pain, unspecified: Secondary | ICD-10-CM

## 2017-09-16 DIAGNOSIS — J9602 Acute respiratory failure with hypercapnia: Secondary | ICD-10-CM

## 2017-09-16 DIAGNOSIS — N179 Acute kidney failure, unspecified: Secondary | ICD-10-CM

## 2017-09-16 DIAGNOSIS — K551 Chronic vascular disorders of intestine: Secondary | ICD-10-CM

## 2017-09-16 DIAGNOSIS — K7200 Acute and subacute hepatic failure without coma: Secondary | ICD-10-CM

## 2017-09-16 DIAGNOSIS — J9601 Acute respiratory failure with hypoxia: Secondary | ICD-10-CM

## 2017-09-16 LAB — COMPREHENSIVE METABOLIC PANEL
ALK PHOS: 101 U/L (ref 38–126)
ALT: 1049 U/L — AB (ref 17–63)
AST: 651 U/L — AB (ref 15–41)
Albumin: 2.7 g/dL — ABNORMAL LOW (ref 3.5–5.0)
Anion gap: 7 (ref 5–15)
BUN: 61 mg/dL — AB (ref 6–20)
CALCIUM: 8 mg/dL — AB (ref 8.9–10.3)
CHLORIDE: 102 mmol/L (ref 101–111)
CO2: 22 mmol/L (ref 22–32)
CREATININE: 2.82 mg/dL — AB (ref 0.61–1.24)
GFR calc Af Amer: 24 mL/min — ABNORMAL LOW (ref >60)
GFR calc non Af Amer: 20 mL/min — ABNORMAL LOW (ref >60)
GLUCOSE: 119 mg/dL — AB (ref 65–99)
Potassium: 4.4 mmol/L (ref 3.5–5.1)
SODIUM: 131 mmol/L — AB (ref 135–145)
Total Bilirubin: 2.6 mg/dL — ABNORMAL HIGH (ref 0.3–1.2)
Total Protein: 5 g/dL — ABNORMAL LOW (ref 6.5–8.1)

## 2017-09-16 LAB — PROCALCITONIN: PROCALCITONIN: 0.99 ng/mL

## 2017-09-16 LAB — APTT: aPTT: 39 seconds — ABNORMAL HIGH (ref 24–36)

## 2017-09-16 LAB — CBC
HCT: 28.9 % — ABNORMAL LOW (ref 40.0–52.0)
HEMOGLOBIN: 9.5 g/dL — AB (ref 13.0–18.0)
MCH: 28.8 pg (ref 26.0–34.0)
MCHC: 33 g/dL (ref 32.0–36.0)
MCV: 87.5 fL (ref 80.0–100.0)
PLATELETS: 61 10*3/uL — AB (ref 150–440)
RBC: 3.3 MIL/uL — AB (ref 4.40–5.90)
RDW: 19.6 % — ABNORMAL HIGH (ref 11.5–14.5)
WBC: 6.8 10*3/uL (ref 3.8–10.6)

## 2017-09-16 LAB — PROTIME-INR
INR: 3.02
Prothrombin Time: 31.1 seconds — ABNORMAL HIGH (ref 11.4–15.2)

## 2017-09-16 LAB — GLUCOSE, CAPILLARY: GLUCOSE-CAPILLARY: 128 mg/dL — AB (ref 65–99)

## 2017-09-16 LAB — TROPONIN I: Troponin I: 0.2 ng/mL (ref <0.03)

## 2017-09-16 MED ORDER — MORPHINE SULFATE (CONCENTRATE) 10 MG/0.5ML PO SOLN
5.0000 mg | ORAL | Status: DC
  Administered 2017-09-16 – 2017-09-17 (×6): 5 mg via ORAL
  Filled 2017-09-16 (×8): qty 1

## 2017-09-16 MED ORDER — LORAZEPAM 1 MG PO TABS
1.0000 mg | ORAL_TABLET | ORAL | Status: DC | PRN
  Administered 2017-09-17: 1 mg via ORAL
  Filled 2017-09-16: qty 1

## 2017-09-16 MED ORDER — ORAL CARE MOUTH RINSE
15.0000 mL | Freq: Two times a day (BID) | OROMUCOSAL | Status: DC
  Administered 2017-09-16 – 2017-09-17 (×3): 15 mL via OROMUCOSAL

## 2017-09-16 NOTE — Progress Notes (Signed)
Progress Note  Patient Name: Jimmy CoronaHarry L Hopkins Date of Encounter: 09/16/2017  [facility] consult by Gollan  Subjective   No acute overnight events. On Precedex. TTE 11/28 showed EF 20-25%, diffuse HK, unable to exclude RWMA, mild to moderate AI, mild MR, moderately dilated LA, mildly dilated RV cavity with mild reduction in RV systolic function, moderate TR, PASP 60 mmHg. PCT improving 1.04-->0.99. Troponin flat trending with a peak of 0.24, now down trending. SUN 57-->61, SCr 2.69-->2.82, AST 2085-->1460-->651, ALT 1777-->1454-->1049, T bili 3.4-->3.0-->2.6, albumin 2.7, Na 131, K+ 4.4, INR 3.49-->3.02, WBC 11.6-->6.8, HGB 11.1-->9.5, PLT 65-->61.   Inpatient Medications    Scheduled Meds: . mouth rinse  15 mL Mouth Rinse BID   Continuous Infusions: . dexmedetomidine (PRECEDEX) IV infusion 0.6 mcg/kg/hr (09/16/17 0800)  . piperacillin-tazobactam (ZOSYN)  IV Stopped (09/16/17 0717)  .  sodium bicarbonate  infusion 1000 mL 50 mL/hr at 09/16/17 0800   PRN Meds: acetaminophen **OR** acetaminophen, bisacodyl, fentaNYL (SUBLIMAZE) injection, [DISCONTINUED] ondansetron **OR** ondansetron (ZOFRAN) IV   Vital Signs    Vitals:   09/16/17 0500 09/16/17 0600 09/16/17 0700 09/16/17 0800  BP: (!) 88/67 (!) 80/66 90/72 (!) 87/71  Pulse: 86 80 72 84  Resp: (!) 22 19 18 20   Temp:    98.3 F (36.8 C)  TempSrc:    Oral  SpO2: 94% 97% 98% 95%  Weight: 154 lb 12.2 oz (70.2 kg)     Height:        Intake/Output Summary (Last 24 hours) at 09/16/2017 0912 Last data filed at 09/16/2017 0800 Gross per 24 hour  Intake 1112.59 ml  Output 2970 ml  Net -1857.41 ml   Filed Weights   08/19/2017 0557 08/28/2017 1130 09/16/17 0500  Weight: 150 lb (68 kg) 158 lb 11.7 oz (72 kg) 154 lb 12.2 oz (70.2 kg)    Telemetry    Afib, 70s bpm - Personally Reviewed  ECG    n/a - Personally Reviewed  Physical Exam   GEN: Cachetic. No acute distress.   Neck: No JVD. Cardiac:  Irregularly irregular, II/VI systolic murmur apex, II/VI systolic murmur RUSB, no rubs, or gallops.  Respiratory: Diminished breath sounds bilaterally.  GI: Soft, nontender, non-distended.   MS: No edema; No deformity. Neuro:  Sedated.  Psych: Sedated.  Labs    Chemistry Recent Labs  Lab 09/06/2017 0555 09/01/2017 1140 08/29/2017 1648 08/29/2017 1953 09/16/17 0558  NA 128* 131* 129* 130* 131*  K 6.9* 5.5* 5.3* 4.9 4.4  CL 99* 101 100* 101 102  CO2 16* 16* 18* 20* 22  GLUCOSE 58* 94 108* 118* 119*  BUN 62* 56* 60* 57* 61*  CREATININE 2.89* 2.66* 2.81* 2.69* 2.82*  CALCIUM 9.3 8.4* 8.3* 8.1* 8.0*  PROT 6.5 5.5*  --   --  5.0*  ALBUMIN 3.6 3.1*  --   --  2.7*  AST 2,085* 1,460*  --   --  651*  ALT 1,777* 1,454*  --   --  1,049*  ALKPHOS 140* 107  --   --  101  BILITOT 3.4* 3.0*  --   --  2.6*  GFRNONAA 20* 22* 21* 22* 20*  GFRAA 23* 25* 24* 25* 24*  ANIONGAP 13 14 11 9 7      Hematology Recent Labs  Lab 09/09/17 1654 08/21/2017 0555 09/16/17 0558  WBC 5.2 11.6* 6.8  RBC 4.16* 3.89* 3.30*  HGB 12.1* 11.1* 9.5*  HCT 36.6* 34.7* 28.9*  MCV 87.9 89.2  87.5  MCH 29.1 28.6 28.8  MCHC 33.1 32.1 33.0  RDW 19.8* 19.6* 19.6*  PLT 169 65* 61*    Cardiac Enzymes Recent Labs  Lab 09/09/17 2027 10-03-17 0555 2017/10/03 1140 09/16/17 0558  TROPONINI 0.03* 0.24* 0.23* 0.20*   No results for input(s): TROPIPOC in the last 168 hours.   BNP Recent Labs  Lab October 03, 2017 0555 10-03-2017 1141  BNP >4,500.0* >4,500.0*     DDimer No results for input(s): DDIMER in the last 168 hours.   Radiology    Dg Chest Port 1 View  Result Date: 09/16/2017 IMPRESSION: Progressive heart failure. Progression of pulmonary edema and progression of bibasilar atelectasis/effusion. Electronically Signed   By: Marlan Palau M.D.   On: 09/16/2017 08:09   Dg Chest Portable 1 View  Result Date: 2017/10/03 IMPRESSION: 1. Moderate bilateral pleural effusions, better demonstrated on CTA today. 2. Thoracic  and abdominal aortic aneurysms, see also CTA report. Electronically Signed   By: Odessa Fleming M.D.   On: 10-03-2017 07:36   Ct Angio Chest/abd/pel For Dissection W And/or Wo Contrast  Result Date: 2017/10/03 IMPRESSION: 1. 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 No aortic dissection. No evidence of aortic rupture, however motion limited evaluation. Recommend followup by abdomen and pelvis CTA in 6 months, and vascular surgery referral/consultation if not already obtained. This recommendation follows ACR consensus guidelines: White Paper of the ACR Incidental Findings Committee II on Vascular Findings. J Am Coll Radiol 2013; 10:789-794. 2. Peripheral vascular disease with severe narrowing of bilateral common femoral arteries which are diminutive in caliber were visualized, diffuse calcified and noncalcified atheromatous plaque route the external iliac and common femoral arteries. Left superficial femoral artery is likely occluded in the lower most portion of field of view. 3. Bilateral renal artery stenosis. Left renal atrophy, decreased profusion of the left kidney compared to right is likely chronic. 4. Possible thrombus in the left atrial appendage. Recommend echocardiogram. 5. Moderate bilateral pleural effusions, with slight increase from exam 6 days prior. 6. Abdominal evaluation limited by patient motion. Diverticulosis without convincing diverticulitis. 7. Small volume intra-abdominopelvic ascites. Body wall and mesenteric edema suggest third-spacing. 8. Indeterminate exophytic lesion from the upper left kidney, previously characterized as proteinaceous cyst on abdominal MRI, with slight increase size (currently 2.4 cm previously 1.8 cm). 9. Hiatal hernia with distal esophageal wall thickening. These results  were called by telephone at the time of interpretation on 03-Oct-2017 at 7:13 am to Dr. Lucrezia Europe , who verbally acknowledged these results. Electronically Signed   By: Rubye Oaks M.D.   On: 2017/10/03 07:16   US Abdomen Limited Ruq  Result Date: 2017-10-03 IMPRESSION: Diffuse gallbladder wall thickening, without definite gallstones or pericholecystic fluid. This finding is nonspecific and may be due to hepatocellular disease. If clinically warranted, nuclear medicine hepatobiliary scan could be obtained for further evaluation. Mild perihepatic ascites, and possible hepatic cirrhosis. Suggest correlation with liver function tests, and consider hepatic elastography ultrasound. No evidence of biliary ductal dilatation. Small right pleural effusion incidentally noted. Electronically Signed   By: Myles Rosenthal M.D.   On: Oct 03, 2017 08:33    Cardiac Studies   TTE 2017/10/03: Study Conclusions  - Left ventricle: The cavity size was mildly dilated. Systolic   function was severely reduced. The estimated ejection fraction   was in the range of 20% to 25%. Diffuse hypokinesis.  Regional   wall motion abnormalities cannot be excluded. - Aortic valve: There was mild to moderate regurgitation. - Mitral valve: There was mild regurgitation. - Left atrium: The atrium was moderately dilated. - Right ventricle: The cavity size was mildly dilated. Wall   thickness was normal. Systolic function was mildly reduced. - Tricuspid valve: There was moderate regurgitation. - Pulmonary arteries: Systolic pressure was moderate to severely   elevated. PA peak pressure: 60 mm Hg (S).  Impressions:  - Large left pleural effusion noted. Rhythm is atrial fibrillation.  Patient Profile     76 y.o. male with history of CAD s/p 4-vessel CABG in 09/2012, ADHD, and has not followed up with a physician in many years who was admitted 11/28 with multiorgan failure, metabolic encephalopathy, failure to thrive and  is being seen today for the evaluation of elevated troponin, acute systolic CHF, and Afib with RVR.   Assessment & Plan    1. Elevated troponin: -Likely supply demand ischemia in the setting of underlying CAD with Afib with RVR, pleural effusion, ARF, septic shock, and anemia -Not a heparin gtt candidate given his thrombocytopenia and shock liver -No plans for emergent cardiac cath given his comorbid conditions  -Not on ASA given thrombocytopenia  -Echo as above  2. Acute systolic CHF/pleural effusion: -Hypotension and ARF preclude diuresis or addition of evidence based heart failure medications at this time -He appears mostly asymptomatic from her pleural effusion, should he become symptomatic could consider thoracentesis -Would ideally need LHC to further evaluate, unfortunately he is not a candidate for this at this time given his acute encephalopathy and comorbid conditions   3. New onset Afib with RVR: -Ventricular rate is reasonably well controlled in the 70s bpm -Not on any medications for rate control -Not a candidate for digoxin given AKI -Hypotension precludes BB -Hypotension and CM make CCB less than ideal -Not on heparin gtt given elevated INR from shock liver and thrombocytopenia  -Consider to monitor -If re-develops Afib with RVR and becomes unstable would likely require emergent DCCV  4. Multiorgan failure/possible hepatic congestion/cardiorenal syndrome: -Cannot rule out underlying cardiogenic shock given his CM -LFT improving  -Renal function continues to worsen  5. Failure to thrive/weight loss: -Unable to exclude underlying process -Consider imaging head  6. Thrombocytopenia/anemia: -PLT count, though mostly stable, transfuse as needed per IM -Anemia likely dilutional  -Per IM  7. Septic shock/leukocytosis: -Off norepi -Empiric ABX per IM -Leukocytosis resolved  8. Hyperkalemia: -Resolved  9. Encephalopathy: -Likely metabolic -Per IM  10. 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 PVD/aortic aneurysms: -Not likely to be a candidate for invasive procedures given his comorbid conditions and acute illness -Seen by vascular surgery  12. Dispo: -Recommend palliative care to discuss goals of care -His acute illness and comorbid conditions preclude invasive cardiac workup at this time   For questions or updates, please contact [facility] Please consult [email or web address] for contact info under Cardiology/STEMI.    Signed, Eula Listen, PA-C [facility] Pager: [phone] 09/16/2017, 9:12 AM   Attending Note Patient seen and examined, agree with detailed note above,  Patient presentation and plan discussed on rounds.   EKG lab work, chest x-ray, echocardiogram reviewed independently by myself  Sedated this morning on nasal cannula oxygen, On dexmedetomidine drip/infusion,  Unable to obtain history this morning  On physical exam sleeping comfortably, does not appear to be in distress, on supplemental oxygen,  Notable  atrophy/muscle wasting arms chest legs Unable to estimate JVP, lungs with crackles at the bases bilaterally, heart sounds irregularly irregular no murmurs appreciated, abdomen thin soft nondistended, no significant lower extremity edema  Lab work personally reviewed showing creatinine 2.82, troponin 0.2, hematocrit 28.9, AST 651, ALT 1000, GFR 20  A/P: 1) elevated troponin Demand ischemia in the setting of dehydration, agitation, atrial fibrillation with RVR, known coronary artery disease with previous bypass surgery Not a non-STEMI Currently not a good candidate for ischemic workup  He is not mentating well, sedated this morning, delirious on arrival in the emergency room yesterday, agitated, pulling off his telemetry wires, climbing out of bed  2) atrial fibrillation, with RVR Elevated rate on presentation, rates have improved with gentle IV  fluids Onset unclear, Currently not a good candidate for anticoagulation given thrombocytopenia, fall risk, agitation INR 3.0 in the setting of liver dysfunction  3) acute renal failure Likely secondary to dehydration Family reports diarrhea, anorexia, not drinking 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 [facility]

## 2017-09-16 NOTE — Progress Notes (Signed)
[facility]  ROUNDING NOTE   Subjective:   UOP 2320mL  On precedex.   Creatinine 2.83 (2.62)  Bicarb gtt 1750mL/hr  Objective:  Vital signs in last 24 hours:  Temp:  [96.1 F (35.6 C)-98.3 F (36.8 C)] 98.3 F (36.8 C) (11/29 0800) Pulse Rate:  [53-106] 84 (11/29 0800) Resp:  [8-26] 20 (11/29 0800) BP: (79-124)/(57-101) 87/71 (11/29 0800) SpO2:  [85 %-100 %] 95 % (11/29 0800) Weight:  [70.2 kg (154 lb 12.2 oz)-72 kg (158 lb 11.7 oz)] 70.2 kg (154 lb 12.2 oz) (11/29 0500)  Weight change: 3.96 kg (8 lb 11.7 oz) Filed Weights   09/03/2017 0557 09/01/2017 1130 09/16/17 0500  Weight: 68 kg (150 lb) 72 kg (158 lb 11.7 oz) 70.2 kg (154 lb 12.2 oz)    Intake/Output: I/O last 3 completed shifts: In: 1082 [I.V.:932; IV Piggyback:150] Out: 2320 [Urine:2320]   Intake/Output this shift:  Total I/O In: 130.6 [I.V.:130.6] Out: 650 [Urine:650]  Physical Exam: General: Ill appearing  Head: Normocephalic, atraumatic. Dry oral mucosal membranes  Eyes: Anicteric, PERRL  Neck: Supple, trachea midline  Lungs:  Bilateral crackles  Heart: Regular rate and rhythm  Abdomen:  Soft, nontender  Extremities: no peripheral edema.  Neurologic: Nonfocal, moving all four extremities  Skin: No lesions       Basic Metabolic Panel: Recent Labs  Lab 09/10/2017 0555 09/02/2017 1140 09/11/2017 1648 09/14/2017 1953 09/16/17 0558  NA 128* 131* 129* 130* 131*  K 6.9* 5.5* 5.3* 4.9 4.4  CL 99* 101 100* 101 102  CO2 16* 16* 18* 20* 22  GLUCOSE 58* 94 108* 118* 119*  BUN 62* 56* 60* 57* 61*  CREATININE 2.89* 2.66* 2.81* 2.69* 2.82*  CALCIUM 9.3 8.4* 8.3* 8.1* 8.0*  MG  --  2.3  --   --   --     Liver Function Tests: Recent Labs  Lab 09/09/17 1654 09/04/2017 0555 09/14/2017 1140 09/16/17 0558  AST 21 2,085* 1,460* 651*  ALT 8* 1,777* 1,454* 1,049*  ALKPHOS 83 140* 107 101  BILITOT 0.9 3.4* 3.0* 2.6*  PROT 6.8 6.5 5.5* 5.0*  ALBUMIN 3.2* 3.6 3.1* 2.7*   Recent Labs  Lab  09/14/2017 0555  LIPASE 52*   No results for input(s): AMMONIA in the last 168 hours.  CBC: Recent Labs  Lab 09/09/17 1654 09/13/2017 0555 09/16/17 0558  WBC 5.2 11.6* 6.8  HGB 12.1* 11.1* 9.5*  HCT 36.6* 34.7* 28.9*  MCV 87.9 89.2 87.5  PLT 169 65* 61*    Cardiac Enzymes: Recent Labs  Lab 09/09/17 1654 09/09/17 2027 09/02/2017 0555 09/14/2017 1140 09/16/17 0558  TROPONINI 0.03* 0.03* 0.24* 0.23* 0.20*    BNP: Invalid input(s): POCBNP  CBG: Recent Labs  Lab 08/25/2017 0906 08/19/2017 0933 09/06/2017 1037 09/14/2017 1054 09/16/17 0730  GLUCAP 56* 104* 120* 121* 128*    Microbiology: Results for orders placed or performed during the hospital encounter of 09/07/2017  MRSA PCR Screening     Status: None   Collection Time: 09/14/2017  5:52 AM  Result Value Ref Range Status   MRSA by PCR NEGATIVE NEGATIVE Final    Comment:        The GeneXpert MRSA Assay (FDA approved for NASAL specimens only), is one component of a comprehensive MRSA colonization surveillance program. It is not intended to diagnose MRSA infection nor to guide or monitor treatment for MRSA infections.   Blood culture (routine x 2)     Status: None (Preliminary result)   Collection  Time: 22-Sep-2017  7:46 AM  Result Value Ref Range Status   Specimen Description   Final    BLOOD Blood Culture results may not be optimal due to an excessive volume of blood received in culture bottles   Special Requests   Final    BOTTLES DRAWN AEROBIC AND ANAEROBIC RIGHT ANTECUBITAL   Culture NO GROWTH < 24 HOURS  Final   Report Status PENDING  Incomplete  Blood culture (routine x 2)     Status: None (Preliminary result)   Collection Time: 09-22-2017  7:47 AM  Result Value Ref Range Status   Specimen Description BLOOD Blood Culture adequate volume  Final   Special Requests   Final    BOTTLES DRAWN AEROBIC AND ANAEROBIC BLOOD RIGHT WRIST   Culture NO GROWTH < 24 HOURS  Final   Report Status PENDING  Incomplete     Coagulation Studies: Recent Labs    22-Sep-2017 1140 09/16/17 0558  LABPROT 34.8* 31.1*  INR 3.49 3.02    Urinalysis: Recent Labs    09-22-17 0700 09-22-17 1259  COLORURINE YELLOW* STRAW*  LABSPEC 1.014 1.010  PHURINE 5.0 5.0  GLUCOSEU NEGATIVE NEGATIVE  HGBUR NEGATIVE SMALL*  BILIRUBINUR NEGATIVE NEGATIVE  KETONESUR NEGATIVE NEGATIVE  PROTEINUR NEGATIVE NEGATIVE  NITRITE NEGATIVE NEGATIVE  LEUKOCYTESUR NEGATIVE NEGATIVE      Imaging: Dg Chest Port 1 View  Result Date: 09/16/2017 CLINICAL DATA:  Respiratory failure EXAM: PORTABLE CHEST 1 VIEW COMPARISON:  2017-09-22 FINDINGS: Progression of vascular congestion and mild edema. Progression of bibasilar atelectasis and small pleural effusions. Prior CABG. IMPRESSION: Progressive heart failure. Progression of pulmonary edema and progression of bibasilar atelectasis/effusion. Electronically Signed   By: Marlan Palau M.D.   On: 09/16/2017 08:09   Dg Chest Portable 1 View  Result Date: 09/22/17 CLINICAL DATA:  76 year old male with chest pain and tachycardia. EXAM: PORTABLE CHEST 1 VIEW COMPARISON:  Chest abdomen and pelvis CTA 0618 hours today and earlier. FINDINGS: Portable AP upright view at 0628 hours. Sequelae of CABG. Stable cardiac size and mediastinal contours. Celsius CTA findings regarding abnormal thoracic aorta. Visualized tracheal air column is within normal limits. Small to moderate bilateral layering pleural effusions greater on the left. No pneumothorax, pulmonary edema, or other confluent pulmonary opacity. IMPRESSION: 1. Moderate bilateral pleural effusions, better demonstrated on CTA today. 2. Thoracic and abdominal aortic aneurysms, see also CTA report. Electronically Signed   By: Odessa Fleming M.D.   On: 2017-09-22 07:36   Ct Angio Chest/abd/pel For Dissection W And/or Wo Contrast  Result Date: 09/22/17 CLINICAL DATA:  Unintentional weight loss.  Abdominal pain. EXAM: CT ANGIOGRAPHY CHEST, ABDOMEN AND PELVIS  TECHNIQUE: Multidetector CT imaging through the chest, abdomen and pelvis was performed using the standard protocol during bolus administration of intravenous contrast. Multiplanar reconstructed images and MIPs were obtained and reviewed to evaluate the vascular anatomy. CONTRAST:  100 cc Isovue 370 IV COMPARISON:  Noncontrast Chest CT 6 days prior 09/09/2017 FINDINGS: CTA CHEST FINDINGS Cardiovascular: No aortic dissection, acute aortic syndrome or aortic hematoma. Fusiform aneurysmal dilatation of the ascending aorta maximal dimension 4.2 cm. Descending aorta is tortuous measure approximately 4.6 cm at the diaphragmatic hiatus. Moderate calcified and noncalcified atheromatous plaque throughout. Conventional branching pattern from the aortic arch. Mild cardiomegaly. Coronary artery calcifications post CABG. Possible thrombus in the left atrial appendage. No central pulmonary embolus. Mild reflux of contrast into the hepatic veins and IVC. Mediastinum/Nodes: No mediastinal or hilar adenopathy. Thickened distal esophagus with associated hiatal hernia. No thyroid  nodule. Lungs/Pleura: Moderate bilateral pleural effusions with slight increased size from prior exam. Associated compressive atelectasis. Subpleural 6 mm nodule in the right middle lobe, unchanged. No confluent airspace disease. Breathing motion artifact limits more detailed assessment. Trachea and mainstem bronchi are patent. Musculoskeletal: Degenerative change in the spine. There are no acute or suspicious osseous abnormalities. Review of the MIP images confirms the above findings. 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 Peripheral calcifications and circumferential mural thrombus. 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 Motion limits evaluation for adjacent soft tissue stranding, no evidence of associated retroperitoneal hemorrhage.  No aortic dissection. Celiac: Patent with mild luminal narrowing at the origin. No dissection or vasculitis. SMA: Patent without evidence of aneurysm, dissection, vasculitis or significant stenosis. Renals: High-grade stenosis at the origin of the left renal artery. High-grade stenosis of the proximal right renal artery just distal to the origin. IMA: Occluded at the origin, some reconstitution but small in caliber. Inflow: Left common iliac artery measures 19 mm, right common iliac artery measures 17 mm just distal to the iliac bifurcation. Moderate calcified and noncalcified atheromatous plaque. There is 50% stenosis of the right external iliac artery. High-grade stenosis of the right common femoral artery due to circumferential plaque. Visualized portion of the right SFA is small in caliber. High-grade stenosis of the left femoral artery, which is diminutive in caliber are were visualized. Probable occlusion of the proximal left superficial femoral artery in the lower most field of view. Veins: Not well assessed on this dedicated arterial study. Review of the MIP images confirms the above findings. NON-VASCULAR Hepatobiliary: No evidence of focal hepatic lesion allowing for arterial phase contrast and patient motion. Gallbladder is obscured by motion. Pancreas: No ductal dilatation or definite peripancreatic inflammation. Motion and lack of enteric contrast limits assessment for focal mass. Spleen: Normal in size. Adrenals/Urinary Tract: Left adrenal thickening. No definite right adrenal nodule. Left renal atrophy, with slight decreased profusion compared to right. Simple cyst in the mid left kidney. Exophytic 2.4 cm lesion from the upper left kidney, nonspecific. Small cysts in the medial mid right kidney. No hydronephrosis. Urinary bladder is minimally distended. Question of bladder wall thickening. Stomach/Bowel: Limited bowel assessment given lack of enteric contrast, patient motion, and presence of  intra-abdominal ascites. Stomach is nondistended. No evidence of bowel obstruction. Diverticulosis of the colon without evidence diverticulitis. Appendix obscured by motion. More detailed bowel evaluation is limited. Lymphatic: No bulky adenopathy, evaluation is limited. Reproductive: Prominent prostate gland. Other: Small volume abdominopelvic ascites. Diffuse body wall mesenteric edema suggest third-spacing. Musculoskeletal: Degenerative change in the lumbar spine. There are no acute or suspicious osseous abnormalities. Review of the MIP images confirms the above findings. IMPRESSION: 1. 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 No aortic dissection. No evidence of aortic rupture, however motion limited evaluation. Recommend followup by abdomen and pelvis CTA in 6 months, and vascular surgery referral/consultation if not already obtained. This recommendation follows ACR consensus guidelines: White Paper of the ACR Incidental Findings Committee II on Vascular Findings. J Am Coll Radiol 2013; 10:789-794. 2. Peripheral vascular disease with severe narrowing of bilateral common femoral arteries which are diminutive in caliber were visualized, diffuse calcified and noncalcified atheromatous plaque route the external iliac and common femoral arteries. Left superficial femoral  artery is likely occluded in the lower most portion of field of view. 3. Bilateral renal artery stenosis. Left renal atrophy, decreased profusion of the left kidney compared to right is likely chronic. 4. Possible thrombus in the left atrial appendage. Recommend echocardiogram. 5. Moderate bilateral pleural effusions, with slight increase from exam 6 days prior. 6. Abdominal evaluation limited by patient motion. Diverticulosis without convincing  diverticulitis. 7. Small volume intra-abdominopelvic ascites. Body wall and mesenteric edema suggest third-spacing. 8. Indeterminate exophytic lesion from the upper left kidney, previously characterized as proteinaceous cyst on abdominal MRI, with slight increase size (currently 2.4 cm previously 1.8 cm). 9. Hiatal hernia with distal esophageal wall thickening. These results were called by telephone at the time of interpretation on 04-10-17 at 7:13 am to Dr. Lucrezia EuropeALLISON WEBSTER , who verbally acknowledged these results. Electronically Signed   By: Rubye OaksMelanie  Ehinger M.D.   On: 04-10-17 07:16   [place] Abdomen Limited Ruq  Result Date: 04-10-17 CLINICAL DATA:  Upper abdominal pain. EXAM: ULTRASOUND ABDOMEN LIMITED RIGHT UPPER QUADRANT COMPARISON:  MRI on 01/18/2008 FINDINGS: Gallbladder: No definite gallstones are seen. Diffuse gallbladder wall thickening is seen measuring up to 6 mm. No evidence of pericholecystic fluid. No sonographic Murphy sign noted by sonographer. Common bile duct: Diameter: 3 mm, within normal limits. Liver: No focal lesion identified. Within normal limits in parenchymal echogenicity. Mild perihepatic ascites is seen and subtle nodularity capsular contour of liver is seen, suspicious for hepatic cirrhosis. Portal vein is patent on color Doppler imaging with normal direction of blood flow towards the liver. Other: Small right pleural effusion. IMPRESSION: Diffuse gallbladder wall thickening, without definite gallstones or pericholecystic fluid. This finding is nonspecific and may be due to hepatocellular disease. If clinically warranted, nuclear medicine hepatobiliary scan could be obtained for further evaluation. Mild perihepatic ascites, and possible hepatic cirrhosis. Suggest correlation with liver function tests, and consider hepatic elastography ultrasound. No evidence of biliary ductal dilatation. Small right pleural effusion incidentally noted. Electronically Signed   By: Myles RosenthalJohn  Stahl  M.D.   On: 04-10-17 08:33     Medications:   . dexmedetomidine (PRECEDEX) IV infusion 0.5 mcg/kg/hr (09/16/17 0845)  . piperacillin-tazobactam (ZOSYN)  IV Stopped (09/16/17 0717)  .  sodium bicarbonate  infusion 1000 mL 50 mL/hr at 09/16/17 0800   . mouth rinse  15 mL Mouth Rinse BID   acetaminophen **OR** acetaminophen, bisacodyl, fentaNYL (SUBLIMAZE) injection, [DISCONTINUED] ondansetron **OR** ondansetron (ZOFRAN) IV  Assessment/ Plan:  Mr. Jimmy CoronaHarry L Hopkins is a 76 y.o. white male [name] is a 76 y.o. white male with ,hypertension, coronary artery disease status post CABG, atrial fibrillation, bilateral renal artery stenosis, peripheral arterial disease, mural thrombus, AAA, thrombocytopenia who was [facility]    1. Acute Renal Failure: with hyperkalemia, hyponatremia, metabolic acidosis: on chronic kidney disease stage III: baseline creatinine of 1.5 GFR of 44 on 09/09/17 Chronic kidney disease secondary to hypertension and renal vascular disease. Bland urinalysis Acute renal failure from sepsis, ATN and prerenal azotemia Hyperkalemia secondary to PO potassium chloride. Status post calcium gluconate, dextrose, furosemide nonoliguric urine output. Responding to IV fluids No acute indication for dialysis.  Discontinued PO potassium - Continue sodium bicarb gtt.   2. Sepsis: with leukocytosis and thrombocytopenia. Required norepinephrine earlier today. Now weaned off.  Empiric antibiotics: pip/tazo and vanco. As per critical care.      LOS: 1 Corianna Avallone 11/29/20189:26 AM

## 2017-09-16 NOTE — Progress Notes (Signed)
[facility] at [place] NAME: Jimmy Hopkins    MR#:  [id number]  DATE OF BIRTH:  Aug 15, 1941  SUBJECTIVE:  CHIEF COMPLAINT:   Chief Complaint  Patient presents with  . Abdominal Pain  looks critically ill, wants cold water REVIEW OF SYSTEMS:  Review of Systems  Unable to perform ROS: Critical illness    DRUG ALLERGIES:   Allergies  Allergen Reactions  . Tetracyclines & Related Hives   VITALS:  Blood pressure 104/69, pulse 85, temperature (!) 97.4 F (36.3 C), temperature source Axillary, resp. rate (!) 24, height 6' (1.829 m), weight 70.2 kg (154 lb 12.2 oz), SpO2 93 %. PHYSICAL EXAMINATION:  Physical Exam  Constitutional: He is oriented to person, place, and time. He appears malnourished. He appears unhealthy. He appears cachectic. He appears toxic. He has a sickly appearance. He appears distressed.  HENT:  Head: Normocephalic and atraumatic.  Eyes: Conjunctivae and EOM are normal. Pupils are equal, round, and reactive to light.  Neck: Normal range of motion. Neck supple. No tracheal deviation present. No thyromegaly present.  Cardiovascular: Normal rate, regular rhythm and normal heart sounds.  Pulmonary/Chest: Effort normal and breath sounds normal. No respiratory distress. He has no wheezes. He exhibits no tenderness.  Abdominal: Soft. Bowel sounds are normal. He exhibits no distension. There is no tenderness.  Musculoskeletal: Normal range of motion.  Neurological: He is alert and oriented to person, place, and time. No cranial nerve deficit.  Skin: Skin is warm and dry. No rash noted.  Psychiatric: Mood and affect normal.   LABORATORY PANEL:  Male CBC Recent Labs  Lab 09/16/17 0558  WBC 6.8  HGB 9.5*  HCT 28.9*  PLT 61*   ------------------------------------------------------------------------------------------------------------------ Chemistries  Recent Labs  Lab 09/07/2017 1140  09/16/17 0558  NA 131*   < >  131*  K 5.5*   < > 4.4  CL 101   < > 102  CO2 16*   < > 22  GLUCOSE 94   < > 119*  BUN 56*   < > 61*  CREATININE 2.66*   < > 2.82*  CALCIUM 8.4*   < > 8.0*  MG 2.3  --   --   AST 1,460*  --  651*  ALT 1,454*  --  1,049*  ALKPHOS 107  --  101  BILITOT 3.0*  --  2.6*   < > = values in this interval not displayed.   RADIOLOGY:  Dg Chest Port 1 View  Result Date: 09/16/2017 CLINICAL DATA:  Respiratory failure EXAM: PORTABLE CHEST 1 VIEW COMPARISON:  09/17/2017 FINDINGS: Progression of vascular congestion and mild edema. Progression of bibasilar atelectasis and small pleural effusions. Prior CABG. IMPRESSION: Progressive heart failure. Progression of pulmonary edema and progression of bibasilar atelectasis/effusion. Electronically Signed   By: Franchot Gallo M.D.   On: 09/16/2017 08:09   ASSESSMENT AND PLAN:  11 y m with multiorgan failure of unknown etio  * Acute Metabolic Encephalopathy  * Multiorgan failure  * Hyperkalemia  * ARF:  * Shock Liver  * Acute CHF  *Elevated troponin:  * Atherosclerotic occlusive disease bilateral lower extremities  * New onset Afib with RVR:  * Acute Thrombocytopenia  * Failure to thrive/weight loss: 50 lbs in 2 months  * Leukocytosis:    I did have long d/w family at bedside, they're leaning towards comfort care. Palliative care also met with family and full comfort care in place. Patient may die  here but if alive, will consider Hospice Home tomorrow depending on beds availability.     All the records are reviewed and case discussed with Care Management/Social Worker. Management plans discussed with the patient, family and they are in agreement.  CODE STATUS: DNR  TOTAL TIME TAKING CARE OF THIS PATIENT: 25 minutes.   More than 50% of the time was spent in counseling/coordination of care: YES  POSSIBLE D/C IN 1 DAYS, DEPENDING ON CLINICAL CONDITION. And Hospice Home beds availability if patient still alive   Max Sane M.D on 09/16/2017 at 3:26 PM  Between 7am to 6pm - Pager - [phone]  After 6pm go to [email or web address] - Proofreader  [facility]  Office  [phone]  CC: Primary care physician; Lynnell Jude, MD  Note: This dictation was prepared with Dragon dictation along with smaller phrase technology. Any transcriptional errors that result from this process are unintentional.

## 2017-09-16 NOTE — Progress Notes (Addendum)
Per Dr. Sung AmabileSimonds, ok to transfer to stepdown, NP Seward GraterMaggie will order palliative consult.  Per MDs Kolloru and Mariah MillingGollan pt is not a candidate for HD, surgery, or cardiac interventions.  Pt resting quietly, titrating precedex down.  Per Dr Ronn MelenaKolloru we will run another day of IV bicarb.  Pt has been bathed this AM.  Tolerated IV start. He yells out intermittently and attempts to get OOB, does not appear to be strong enough to get up at this time, however.

## 2017-09-16 NOTE — Consult Note (Signed)
Consultation Note Date: 09/16/2017   Patient Name: Jimmy Hopkins  DOB: 19-Jan-1941  MRN: [id number]  Age / Sex: 76 y.o., male  PCP: Dortha Kern, MD Referring Physician: Delfino Lovett, MD  Reason for Consultation: Establishing goals of care and Psychosocial/spiritual support  HPI/Patient Profile: 76 y.o. male   admitted on 09/06/2017 with past medical history of CAD s/p 4-vessel CABG in 09/2012, ADHD came in for abd pain and SOB. His abdominal painx 3-4 days, reports diarrhea, and significant weight loss of 50 pounds over the past 2 months.  Family reports dramatic physical, functional, and cognitive decline over the past month.  While in ED he was noted to have elevated LFT's, Hyperkalemia (K 6.9) and elevated renal function. He also had Lactic acidosis, elevated troponin and Afib with RVR.  Admitted for further eval and mgmt.  Family face treatment option decisions, advanced directive decisions, and anticipatory care needs.   Clinical Assessment and Goals of Care:   This NP Lorinda Creed reviewed medical records, received report from team, assessed the patient and then meet at the patient's bedside along with his family to include his 2 sisters and 2 nephews to discuss diagnosis, prognosis, GOC, EOL wishes disposition and options.  A detailed discussion was had today regarding advanced directives.  Concepts specific to code status, artifical feeding and hydration, continued IV antibiotics and rehospitalization was had.  The difference between a aggressive medical intervention path  and a palliative comfort care path for this patient at this time was had.  Values and goals of care important to patient and family were attempted to be elicited.  All family members agree that given the current situation and they are deep understanding of Jimmy Hopkins as a person they believe that a shift to a comfort care  approach is in his best interest.  They speak of Jimmy Hopkins with love.  Jimmy Hopkins was never married and his nephews were like his own children.  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  He is especially loved working with  troubled children.  He loves to fish, he was fiercely independent, and he was a Haematologist.   MOST form completed.  Concept of Hospice and Palliative Care were discussed  Natural trajectory and expectations at EOL were discussed.  Questions and concerns addressed.   Family encouraged to call with questions or concerns.  PMT will continue to support holistically.   NEXT OF KIN/ nephew and sisters    SUMMARY OF RECOMMENDATIONS    Code Status/Advance Care Planning:  DNR-documented today   Symptom Management:   Pain Roxanol 5 mg p.o./sublingual every 4 hours scheduled           Fentanyl 12.52 25 mcg intravenous every 4 hours as needed for severe pain  Agitation: Ativan 1 mg p.o./sublingual every 4 hours as needed  Palliative Prophylaxis:   Aspiration, Bowel Regimen, Delirium Protocol and Oral Care  Additional Recommendations (Limitations, Scope, Preferences):  Full Comfort Care  Psycho-social/Spiritual:   Desire for further  Chaplaincy support:no  Additional Recommendations: Education on Hospice  Prognosis:   < 2 weeks  Discharge Planning: Implement full comfort path.  Reevaluate patient in the morning to determine transition of care to a hospice facility.  Family is hopeful for end-of-life care at the [facility] hosp[facility] facility.  Discussed with Dr. Clelia CroftShaw  To Be Determined      Primary Diagnoses: Present on Admission: . Sepsis (HCC)   I have reviewed the medical record, interviewed the patient and family, and examined the patient. The following aspects are pertinent.  Past Medical History:  Diagnosis Date  . ADHD   . CAD (coronary artery disease)    a. s/p 4-vessel CABG on 10/04/2012 at [facility] with LIMA-LAD, VG-OM1, VG-OM2, VG-PDA    Social History   Socioeconomic History  . Marital status: Single    Spouse name: None  . Number of children: None  . Years of education: None  . Highest education level: None  Social Needs  . Financial resource strain: Not hard at all  . Food insecurity - worry: Never true  . Food insecurity - inability: Never true  . Transportation needs - medical: No  . Transportation needs - non-medical: No  Occupational History  . None  Tobacco Use  . Smoking status: Current Every Day Smoker    Packs/day: 1.00    Types: Cigarettes  . Smokeless tobacco: Never Used  Substance and Sexual Activity  . Alcohol use: No    Frequency: Never  . Drug use: No  . Sexual activity: No  Other Topics Concern  . None  Social History Narrative  . None   Family History  Problem Relation Age of Onset  . Heart disease Mother   . Heart disease Father   . Heart disease Brother    Scheduled Meds: . mouth rinse  15 mL Mouth Rinse BID   Continuous Infusions: . dexmedetomidine (PRECEDEX) IV infusion Stopped (09/16/17 1230)  .  sodium bicarbonate  infusion 1000 mL 50 mL/hr at 09/16/17 1200   PRN Meds:.acetaminophen **OR** acetaminophen, bisacodyl, fentaNYL (SUBLIMAZE) injection, [DISCONTINUED] ondansetron **OR** ondansetron (ZOFRAN) IV Medications Prior to Admission:  Prior to Admission medications   Medication Sig Start Date End Date Taking? Authorizing Provider  albuterol (PROVENTIL HFA;VENTOLIN HFA) 108 (90 Base) MCG/ACT inhaler Inhale 2 puffs into the lungs every 6 (six) hours as needed for wheezing or shortness of breath. 09/09/17   Dionne BucySiadecki, Sebastian, MD  methylphenidate (RITALIN) 10 MG tablet Take 1 tablet by mouth 2 (two) times daily.    [provider]  methylphenidate (RITALIN) 20 MG tablet Take 1 tablet by mouth 3 (three) times daily. 06/16/17   [provider]  potassium chloride 20 MEQ TBCR Take 20 mEq by mouth 2 (two) times daily for 14 days. Patient not taking: Reported  on 19-Nov-2016 09/10/17 09/24/17  Dionne BucySiadecki, Sebastian, MD   Allergies  Allergen Reactions  . Tetracyclines & Related Hives   Review of Systems  Unable to perform ROS: Mental status change    Physical Exam  Constitutional: He appears lethargic. He appears cachectic. He appears ill.  Cardiovascular: Normal rate, regular rhythm and normal heart sounds.  Pulmonary/Chest: He has decreased breath sounds.  Musculoskeletal:  -generlized weakness  Neurological: He appears lethargic.  Skin: Skin is warm and dry.  - grayish skin tone    Vital Signs: BP (!) 86/69 (BP Location: Right Arm)   Pulse 88   Temp (!) 97.4 F (36.3 C) (Axillary)   Resp 16   Ht  6' (1.829 m)   Wt 70.2 kg (154 lb 12.2 oz)   SpO2 98%   BMI 20.99 kg/m  Pain Assessment: CPOT   Pain Score: Asleep   SpO2: SpO2: 98 % O2 Device:SpO2: 98 % O2 Flow Rate: .O2 Flow Rate (L/min): 0.5 L/min  IO: Intake/output summary:   Intake/Output Summary (Last 24 hours) at 09/16/2017 1314 Last data filed at 09/16/2017 1200 Gross per 24 hour  Intake 1340.05 ml  Output 3880 ml  Net -2539.95 ml    LBM: Last BM Date: 09/07/2017 Baseline Weight: Weight: 68 kg (150 lb) Most recent weight: Weight: 70.2 kg (154 lb 12.2 oz)     Palliative Assessment/Data: 30 % at best   Discussed with Dr Sherryll BurgerShah  Time In: 1420 Time Out: 1545 Time Total: 75 min Greater than 50%  of this time was spent counseling and coordinating care related to the above assessment and plan.  Signed by: Lorinda CreedMary Maripaz Mullan, NP   Please contact Palliative Medicine Team phone at [phone] for questions and concerns.  For individual provider: See Loretha StaplerAmion

## 2017-09-16 NOTE — Progress Notes (Signed)
Initial Nutrition Assessment  DOCUMENTATION CODES:   Severe malnutrition in context of chronic illness  INTERVENTION:  Will monitor outcome of discussions regarding goals of care. Will also monitor for diet advancement.  Once diet able to be advanced patient would benefit from Ensure Enlive po TID, each supplement provides 350 kcal and 20 grams of protein.   Also recommend providing daily MVI with diet advancement.  He would also likely benefit from a dysphagia 3 diet (mechanical soft) when diet able to be advanced due to poor dentition, which may cause difficulty chewing.  NUTRITION DIAGNOSIS:   Severe Malnutrition related to chronic illness(chronic abdominal pain, inadequate oral intake) as evidenced by 15.2% weight loss over 3 months, severe fat depletion, severe muscle depletion.  GOAL:   Patient will meet greater than or equal to 90% of their needs  MONITOR:   PO intake, Diet advancement, Labs, Weight trends, I & O's  REASON FOR ASSESSMENT:   Malnutrition Screening Tool    ASSESSMENT:   76 year old male with PMHx of ADHD, CAD s/p 4-vessel CABG on 10/04/2012 at [facility], recent admission at [facility] where patient presented with sudden onset right groin pain and was found to have incarcerated right inguinal hernia s/p open repair and reduction on 06/25/2017, who now presents from home with abdominal pain, diarrhea, SOB, decreased appetite found to have elevated LFT's, acute metabolic encephalopathy, multiorgan failure, new onset A-fib with RVR, acute CHF (EF 20-25% on Echo 11/28), abdominal aortic aneurysm without aortic dissection, 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   -Pending PMT consult to discuss goals of care.  Patient sleeping soundly at time of assessment and did not wake to name call or during NFPE. Able to to speak with his sister Vladimir Crofts over the phone. She reports patient lives alone at home. She believes  he has not been eating well for at least 2 months due to stomach pain. She is not sure exactly how much he is eating every day but believes if he is eating it is likely snacks and ready-prepared foods, and not full meals with protein. She reports patient used to be very independent. She does not believe he was having any abdominal distention or nausea, but she is not sure. Sister reports there is some type of esophageal dysmotility that runs in the family but she is not sure if the patient has it as he has not complained of any trouble swallowing.  On 06/26/2017 patient was 182.5 lbs. On a surgery follow-up on 9/25 he was already down to 172 lbs. His sister believes his weight loss likely started even before the surgery because his UBW used to be in the 190s. Since 9/8 patient has lost 27.7 lbs (15.2% body weight) over the past 3 months, which is significant for time frame.  Medications reviewed and include: Medline mouth rinse, Precedex gtt, sodium bicarbonate 150 mEq in D5 at 50 mL/hr.  Labs reviewed: CBG 128, Sodium 131, BUN 61, Creatinine 2.82, Albumin 2.7, AST 651, ALT 1049, Total Protein 5, T Bili 2.6, eGFR 20, elevated Troponin.  Discussed with RN. Patient has a full set of upper teeth that are out now. Only has a few teeth on the bottom.  NUTRITION - FOCUSED PHYSICAL EXAM:    Most Recent Value  Orbital Region  Severe depletion  Upper Arm Region  Severe depletion  Thoracic and Lumbar Region  Severe depletion  Buccal Region  Severe depletion  Temple Region  Severe  depletion  Clavicle Bone Region  Severe depletion  Clavicle and Acromion Bone Region  Severe depletion  Scapular Bone Region  Severe depletion  Dorsal Hand  Moderate depletion  Patellar Region  Moderate depletion  Anterior Thigh Region  Moderate depletion  Posterior Calf Region  Moderate depletion  Edema (RD Assessment)  None  Hair  Reviewed  Eyes  Unable to assess  Mouth  Unable to assess  Skin  Reviewed  Nails  Reviewed      Diet Order:  Diet NPO time specified  EDUCATION NEEDS:   Not appropriate for education at this time  Skin:  Skin Assessment: Reviewed RN Assessment(no issues)  Last BM:  09/09/2017  Height:   Ht Readings from Last 1 Encounters:  09/05/2017 6' (1.829 m)    Weight:   Wt Readings from Last 1 Encounters:  09/16/17 154 lb 12.2 oz (70.2 kg)    Ideal Body Weight:  80.9 kg  BMI:  Body mass index is 20.99 kg/m.  Estimated Nutritional Needs:   Kcal:  4132-4401 (MSJ x 1.2-1.4)  Protein:  85-100 (1.2-1.4 grams/kg)  Fluid:  1.8 L/day (25 mL/kg)  Willey Blade, MS, RD, LDN Office: [phone] Pager: [phone] After Hours/Weekend Pager: [phone]

## 2017-09-16 NOTE — Progress Notes (Signed)
PT PROFILE: 16 M with minimal documented PMH.  He underwent recent right inguinal hernia repair at [facility].  He presented to [facility] ED 11/28 with abdominal pain, weight loss (reportedly 50 pounds in past 2 months), diarrhea, altered mental status.  He was noted to have multiple abnormalities including AKI, severe hyperkalemia, markedly elevated LFTs.  He was noted to be in atrial fibrillation which has not been previously documented. Trop I 0.24, BNP > 4500  MAJOR EVENTS/TEST RESULTS: 11/28 CT chest:  No aortic dissection, acute aortic syndrome or aortic hematoma. Fusiform aneurysmal dilatation of the ascending aorta maximal dimension 4.2 cm. No central pulmonary embolus. Moderate bilateral pleural effusions with associated compressive atelectasis.  11/28 CTAP:  Infrarenal aortic aneurysm measures 4.7 x 4.5 cm. Peripheral calcifications and circumferential mural thrombus. 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 High-grade stenosis at the origin of the left renal artery.  Extensive mesenteric arterial disease.  11/28 RUQ [place]: Diffuse gallbladder wall thickening, without definite gallstones or pericholecystic fluid. This finding is nonspecific 11/28 Echocardiogram: LVEF 20-25%.  LV mildly dilated.  Diffuse hypokinesis.  Mild to moderate aortic regurgitation.  Mild mitral regurgitation.  LA is moderately dilated.  RV mildly dilated.  Moderate tricuspid regurg.  Estimated PA systolic pressure 60 mmHg 11/28 Vascular Surgery consultation: AAA -deemed poor candidate for surgical intervention.  Recommend surveillance over time.  Atherosclerotic bilateral LE occlusive arterial disease -no specific interventions recommended. 11/28 nephrology consultation: Rec continued HCO3 infusion and furosemide as permitted by BP 67/67 cardiology consultation: No specific intervention recommended at this time  INDWELLING DEVICES::   MICRO DATA: MRSA PCR 11/28 >>  NEG Blood 11/28 >>   ANTIMICROBIALS:  Vanc 11/28 >> 11/28 Pip-tazo 11/28 >> 11/29     SUBJ: Somnolent on dexmedetomidine infusion No overt respiratory distress Oriented only to person Denies abdominal pain  OBJ: Vitals:   09/16/17 0600 09/16/17 0700 09/16/17 0800 09/16/17 0900  BP: (!) 80/66 90/72 (!) 87/71 (!) 85/60  Pulse: 80 72 84 83  Resp: 19 18 20  (!) 27  Temp:   98.3 F (36.8 C)   TempSrc:   Oral   SpO2: 97% 98% 95% 99%  Weight:      Height:       Somnolent NAD HEENT: NCAT, mild sclericterus JVD present No wheezes or adventitious sounds anteriorly Abdomen flat, nontender, diminished BS Extremities cool, no edema, cyanosis of all toes Cognition poor but no focal neurologic deficits   BMP Latest Ref Rng & Units 09/16/2017 09/04/2017 09/14/2017  Glucose 65 - 99 mg/dL 119(H) 118(H) 108(H)  BUN 6 - 20 mg/dL 61(H) 57(H) 60(H)  Creatinine 0.61 - 1.24 mg/dL 2.82(H) 2.69(H) 2.81(H)  Sodium 135 - 145 mmol/L 131(L) 130(L) 129(L)  Potassium 3.5 - 5.1 mmol/L 4.4 4.9 5.3(H)  Chloride 101 - 111 mmol/L 102 101 100(L)  CO2 22 - 32 mmol/L 22 20(L) 18(L)  Calcium 8.9 - 10.3 mg/dL 8.0(L) 8.1(L) 8.3(L)    Hepatic Function Latest Ref Rng & Units 09/16/2017 09/13/2017 08/27/2017  Total Protein 6.5 - 8.1 g/dL 5.0(L) 5.5(L) 6.5  Albumin 3.5 - 5.0 g/dL 2.7(L) 3.1(L) 3.6  AST 15 - 41 U/L 651(H) 1,460(H) 2,085(H)  ALT 17 - 63 U/L 1,049(H) 1,454(H) 1,777(H)  Alk Phosphatase 38 - 126 U/L 101 107 140(H)  Total Bilirubin 0.3 - 1.2 mg/dL 2.6(H) 3.0(H) 3.4(H)   PT 31.1s   INR 3.02    CBC Latest Ref Rng & Units 09/16/2017 09/02/2017 09/09/2017  WBC 3.8 - 10.6 K/uL 6.8 11.6(H) 5.2  Hemoglobin 13.0 - 18.0 g/dL 9.5(L) 11.1(L) 12.1(L)  Hematocrit 40.0 - 52.0 % 28.9(L) 34.7(L) 36.6(L)  Platelets 150 - 440 K/uL 61(L) 65(L) 169    IMPRESSION: Severe extensive vascular disease New onset AF, rate controlled Severe dilated cardiomyopathy, chronic systolic CHF  Mild hypoxic respiratory  failure Mild pulmonary edema with bilateral pl effusions  AKI, nonoliguric  Severe renovascular disease Hyperkalemia - resolved Metabolic acidosis - improved on HCO3 infusion Abdominal pain, chronic nausea, weight loss - likely due to chronic mesenteric ischemia Protein-calorie malnutrition Elevated LFT - suspect hepatic congestion. Improving No acute infectious processes identified Elevated PT INR - likely due to hepatic dysfunction and malnutrition Thrombocytopenia Acute encephalopathy/TME - presently controlled on dexmedetomidine  DISCUSSION: Overall, this is a daunting constellation of problems, most of which have few good options for treatment or management.  He is a poor candidate for any surgical interventions, cardiac catheterization or hemodialysis of any duration.  Goals of care need to be discussed with family as patient is presently unable to offer any insights due to encephalopathy  PLAN/REC: Continue supplemental oxygen Wean off dexmedetomidine as able Continue furosemide as permitted by BP and renal function Continue bicarbonate infusion for today.  Discussed with nephrology Discontinue Zosyn Avoiding all anticoagulation due to coagulopathy Follow CBC, CMET Input from from all consultants appreciated Palliative care consultation requested  Merton Border, MD PCCM service Mobile [phone] Pager [phone] 09/16/2017 11:42 AM

## 2017-09-17 NOTE — Progress Notes (Signed)
[facility] - [place] [facility]   PATIENT NAME: Jimmy ArdsHarry Hopkins    MR#:  [id number]  DATE OF BIRTH:  09-27-1941  SUBJECTIVE:  CHIEF COMPLAINT:   Chief Complaint  Patient presents with  . Abdominal Pain  looks critically ill REVIEW OF SYSTEMS:  Review of Systems  Unable to perform ROS: Critical illness   DRUG ALLERGIES:   Allergies  Allergen Reactions  . Tetracyclines & Related Hives   VITALS:  Blood pressure 118/83, pulse (!) 134, temperature (!) 97.5 F (36.4 C), temperature source Axillary, resp. rate (!) 23, height 6' (1.829 m), weight 70.2 kg (154 lb 12.2 oz), SpO2 92 %. PHYSICAL EXAMINATION:  Physical Exam  Constitutional: He is oriented to person, place, and time. He appears malnourished. He appears unhealthy. He appears cachectic. He appears toxic. He has a sickly appearance. He appears distressed.  HENT:  Head: Normocephalic and atraumatic.  Eyes: Conjunctivae and EOM are normal. Pupils are equal, round, and reactive to light.  Neck: Normal range of motion. Neck supple. No tracheal deviation present. No thyromegaly present.  Cardiovascular: Normal rate, regular rhythm and normal heart sounds.  Pulmonary/Chest: Effort normal and breath sounds normal. No respiratory distress. He has no wheezes. He exhibits no tenderness.  Abdominal: Soft. Bowel sounds are normal. He exhibits no distension. There is no tenderness.  Musculoskeletal: Normal range of motion.  Neurological: He is alert and oriented to person, place, and time. No cranial nerve deficit.  Skin: Skin is warm and dry. No rash noted.  Psychiatric: Mood and affect normal.   LABORATORY PANEL:  Male CBC Recent Labs  Lab 09/16/17 0558  WBC 6.8  HGB 9.5*  HCT 28.9*  PLT 61*   ------------------------------------------------------------------------------------------------------------------ Chemistries  Recent Labs  Lab 2017-07-19 1140  09/16/17 0558  NA 131*   < > 131*  K 5.5*   <  > 4.4  CL 101   < > 102  CO2 16*   < > 22  GLUCOSE 94   < > 119*  BUN 56*   < > 61*  CREATININE 2.66*   < > 2.82*  CALCIUM 8.4*   < > 8.0*  MG 2.3  --   --   AST 1,460*  --  651*  ALT 1,454*  --  1,049*  ALKPHOS 107  --  101  BILITOT 3.0*  --  2.6*   < > = values in this interval not displayed.   RADIOLOGY:  No results found. ASSESSMENT AND PLAN:  [age] y m with multiorgan failure of unknown etio  * Acute Metabolic Encephalopathy * Multiorgan failure * Hyperkalemia * ARF * Shock Liver * Acute CHF *Elevated troponin * Atherosclerotic occlusive disease bilateral lower extremities * New onset Afib with RVR * Acute Thrombocytopenia * Failure to thrive/weight loss: 50 lbs in 2 months * Leukocytosis:    May be able to go Hospice Home - comfort care in place     All the records are reviewed and case discussed with Care Management/Social Worker. Management plans discussed with the patient, family and they are in agreement.  CODE STATUS: DNR  TOTAL TIME TAKING CARE OF THIS PATIENT: 15 minutes.   More than 50% of the time was spent in counseling/coordination of care: Gwendolyn LimaYES   Kameron Glazebrook M.D on 09/17/2017 at 2:33 PM  Between 7am to 6pm - Pager - [phone]  After 6pm go to [email or web address] - Social research officer, [email or web address] [email or web address]  [facility]  Office  [phone]  CC: Primary care physician; Dortha KernBliss, Laura K, MD  Note: This dictation was prepared with Dragon dictation along with smaller phrase technology. Any transcriptional errors that result from this process are unintentional.

## 2017-09-17 NOTE — Progress Notes (Signed)
[facility]     Date: 09/17/2017,   MRN# [id number] Jimmy CoronaHarry L Kuechle 04-22-1941 Code Status:     Code Status Orders  (From admission, onward)        Start     Ordered   09/16/17 1516  Do not attempt resuscitation (DNR)  Continuous    Question Answer Comment  In the event of cardiac or respiratory ARREST Do not call a "code blue"   In the event of cardiac or respiratory ARREST Do not perform Intubation, CPR, defibrillation or ACLS   In the event of cardiac or respiratory ARREST Use medication by any route, position, wound care, and other measures to relive pain and suffering. May use oxygen, suction and manual treatment of airway obstruction as needed for comfort.      09/16/17 1517    Code Status History    Date Active Date Inactive Code Status Order ID Comments User Context   09/10/2017 11:06 09/16/2017 15:17 Full Code [id number]  Delfino LovettShah, Vipul, MD Inpatient     Hosp day:@LENGTHOFSTAYDAYS @ Referring MD: @ATDPROV @     PCP:      AdmissionWeight: 150 lb (68 kg)                 CurrentWeight: 154 lb 12.2 oz (70.2 kg) Jimmy Hopkins is a 76 y.o. old male     SUBJECTIVE:   No acute overnight events. Patient is comfortable and not in pain.  o  MEDICATIONS    Current Medication:   Current Facility-Administered Medications:  .  acetaminophen (TYLENOL) tablet 650 mg, 650 mg, Oral, Q6H PRN **OR** acetaminophen (TYLENOL) suppository 650 mg, 650 mg, Rectal, Q6H PRN, Sherryll BurgerShah, Vipul, MD .  fentaNYL (SUBLIMAZE) injection 12.5-25 mcg, 12.5-25 mcg, Intravenous, Q4H PRN, Eugenie NorrieBlakeney, Dana G, NP, 25 mcg at 09/17/17 [id number] .  LORazepam (ATIVAN) tablet 1 mg, 1 mg, Oral, Q4H PRN, Canary BrimLarach, Mary W, NP, 1 mg at 09/17/17 0819 .  MEDLINE mouth rinse, 15 mL, Mouth Rinse, BID, Merwyn KatosSimonds, David B, MD, 15 mL at 09/17/17 [id number] .  morphine CONCENTRATE 10 MG/0.5ML oral solution 5 mg, 5 mg, Oral, Q4H, Canary BrimLarach, Mary W, NP, 5 mg at 09/17/17 0710 .  [DISCONTINUED] ondansetron  (ZOFRAN) tablet 4 mg, 4 mg, Oral, Q6H PRN **OR** ondansetron (ZOFRAN) injection 4 mg, 4 mg, Intravenous, Q6H PRN, Sherryll BurgerShah, Vipul, MD     REVIEW OF SYSTEMS   ROS   VS: BP 94/71   Pulse (!) 113   Temp 97.6 F (36.4 C) (Axillary)   Resp 13   Ht 6' (1.829 m)   Wt 154 lb 12.2 oz (70.2 kg)   SpO2 (!) 83%   BMI 20.99 kg/m      PHYSICAL EXAM   Physical Exam Comfortable. No distress. Chest: CTA bil. No wheezes or rhonchi CVS: S1, S2 0  Abd: benign. Non tender.    LABS    Recent Labs    08/29/2017 0555 08/27/2017 1140 09/09/2017 1648 09/08/2017 1953 09/16/17 0558  HGB 11.1*  --   --   --  9.5*  HCT 34.7*  --   --   --  28.9*  MCV 89.2  --   --   --  87.5  WBC 11.6*  --   --   --  6.8  BUN 62* 56* 60* 57* 61*  CREATININE 2.89* 2.66* 2.81* 2.69* 2.82*  GLUCOSE 58* 94 108* 118* 119*  CALCIUM 9.3 8.4* 8.3* 8.1* 8.0*  INR  --  3.49  --   --  3.02  ,    No results for input(s): PH in the last 72 hours.  Invalid input(s): PCO2, PO2, BASEEXCESS, BASEDEFICITE, TFT    CULTURE RESULTS   Recent Results (from the past 240 hour(s))  MRSA PCR Screening     Status: None   Collection Time: 08/27/2017  5:52 AM  Result Value Ref Range Status   MRSA by PCR NEGATIVE NEGATIVE Final    Comment:        The GeneXpert MRSA Assay (FDA approved for NASAL specimens only), is one component of a comprehensive MRSA colonization surveillance program. It is not intended to diagnose MRSA infection nor to guide or monitor treatment for MRSA infections.   Blood culture (routine x 2)     Status: None (Preliminary result)   Collection Time: 09/12/2017  7:46 AM  Result Value Ref Range Status   Specimen Description   Final    BLOOD Blood Culture results may not be optimal due to an excessive volume of blood received in culture bottles   Special Requests   Final    BOTTLES DRAWN AEROBIC AND ANAEROBIC RIGHT ANTECUBITAL   Culture NO GROWTH 2 DAYS  Final   Report Status PENDING  Incomplete  Blood  culture (routine x 2)     Status: None (Preliminary result)   Collection Time: 08/24/2017  7:47 AM  Result Value Ref Range Status   Specimen Description BLOOD Blood Culture adequate volume  Final   Special Requests   Final    BOTTLES DRAWN AEROBIC AND ANAEROBIC BLOOD RIGHT WRIST   Culture NO GROWTH 2 DAYS  Final   Report Status PENDING  Incomplete          IMAGING    No results found.     ASSESSMENT/PLAN     76 y/o gentleman admitted to the ICU and treated for:   Severe extensive vascular disease New onset AF, rate controlled Severe dilated cardiomyopathy, chronic systolic CHF  Mild hypoxic respiratory failure Mild pulmonary edema with bilateral pl effusions  AKI, nonoliguric  Severe renovascular disease Hyperkalemia - resolved Metabolic acidosis - improved on HCO3 infusion Abdominal pain, chronic nausea, weight loss - likely due to chronic mesenteric ischemia Protein-calorie malnutrition Elevated LFT - suspect hepatic congestion. Improving No acute infectious processes identified Elevated PT INR - likely due to hepatic dysfunction and malnutrition Thrombocytopenia Acute encephalopathy/TME - presently controlled on dexmedetomidine   Now goals of care have been transitioned to comfort care. Patient looks comfortable. Continue PRN fentanyl, morphine and ativan. Transfer to the floor. Case management consult for evaluation to discharge to hospice.    Cristy HiltsIrtaza Brylan Dec, M.D.  Pulmonary & Critical Care Medicine

## 2017-09-17 NOTE — Progress Notes (Signed)
Report called to Dedra on 1C, pt transferred to 1C bed, family preceded him to room 114, his belongings and chart transferred with him, no need for supp O2, VSS on room air, pt alert at time of transfer, but drifts off to sleep easily.

## 2017-09-17 NOTE — Care Management Note (Signed)
Case Management Note  Patient Details  Name: BOONE GEAR MRN: [id number] Date of Birth: 13-Dec-1940  Subjective/Objective:                 Palliative and attending has met with family and wish to pursue hospice facility.  Spoke with sister Vladimir Crofts and agency preference is [employer].   Action/Plan:  Referral to Craige Cotta with [facility].  Updated charge nurse  Expected Discharge Date:                  Expected Discharge Plan:     In-House Referral:     Discharge planning Services     Post Acute Care Choice:    Choice offered to:     DME Arranged:    DME Agency:     HH Arranged:    HH Agency:     Status of Service:     If discussed at [employer], dates discussed:    Additional Comments:  Katrina Stack, RN 09/17/2017, 12:37 PM

## 2017-09-17 NOTE — Progress Notes (Signed)
New Hospice home referral received from [place] following [facility]. Patient is a 76 year old male with a PMH 75 year old man admitted on 08/22/2017 with past medical history of CAD s/p 4-vessel CABG in 09/2012, ADHDand hernia repair 06/2017 who was brought to the [facility] ED for evaluation of abd pain and shortness of breath. In the ED he was found to have elevated LFT's, hyperkalemia and elevated renal function as well as elevated troponin, A fib w/RVR and lactic acidosis. Family reported a 50 pound weight loss over the past 2 months. Palliative medicine was consulted for goals of care and met with patient's sisters and nephews. They have chosen to focus on comfort with transfer to the hospice home.  Writer met in the family room with patient's sisters Stanton Kidney and Lelon Frohlich and nephew Konrad Dolores to initiate education regarding hospice services, philosophy and team approach to care with good understanding voiced.Questions answered, consents sighed. Plan is for discharge TOMORROW 12/1 To the Hospice home for management of pain and anxiety. Patient is currently requiring scheduled morphine concentrate 5 mg q 4 hrs for pain as well as as needed fentanyl 25 MCG q 4 hrs PRN IV and lorazepam 1 mg oral q 4 hrs PRN for anxiety. He is eating bites and sips and is total care for ADL's. Patient seen lying in bed, appeared to be sleeping, will awaken to voice and is easily agitated. He did not participate in the conversation. Patient information faxed to referral. Hospital care team and family updated and aware of discharge. Patient to discharge to the hospice home tomorrow 12/1 via EMS with signed DNR in place.  Flo Shanks RN, BSN, [facility] [place], hospital liaison [phone] c

## 2017-09-17 NOTE — Progress Notes (Signed)
RN Dedra calls to tell me she gave Roxanol 5 mg/025 mL and wasted 15 mg/0.75 mL and Sherol DadeStephanie Kennedy witnessed her waste. For some reason [facility] isn't accepting her documentation, so she asked me to enter this note.   Cutter Passey A. Pra[place] Hillookson, VermontPharm.D., BCPS Clinical Pharmacist 09/17/2017 18:29

## 2017-09-18 DEATH — deceased

## 2017-09-20 LAB — MISC LABCORP TEST (SEND OUT): Labcorp test code: 70115

## 2017-09-20 LAB — CULTURE, BLOOD (ROUTINE X 2)
Culture: NO GROWTH
Culture: NO GROWTH
SPECIMEN DESCRIPTION: ADEQUATE

## 2017-10-19 NOTE — Progress Notes (Signed)
[facility] [place] [facility]    Death Note - please see Last Note for all details.  PATIENT NAME: Jimmy ArdsHarry Burnstein    MR#:  [id number]  DATE OF BIRTH:  Aug 18, 1941  DATE OF ADMISSION:  09/02/2017  PRIMARY CARE PHYSICIAN: Dortha KernBliss, Laura K, MD   ADMISSION DIAGNOSIS:  Hyperkalemia [E87.5] Peripheral vascular disease (HCC) [I73.9] Thoracic aortic aneurysm without rupture (HCC) [I71.2] Pain of upper abdomen [R10.10] Abdominal pain [R10.9] Sepsis, due to unspecified organism [facility](HCC) [A41.9] Acute renal failure, unspecified acute renal failure type (HCC) [N17.9] Abdominal aortic aneurysm (AAA) without rupture (HCC) [I71.4] Acute liver failure without hepatic coma [K72.00]    Pronounced dead by Anne HahnWILLIS, Brelee Renk FIELDING on 10/01/2017 at 12:00 AM after 1 full minute of auscultation revealed absent heart and lung sounds, absent corneal and pupillary reflexes, and absent response to painful stimuli.                  Cause of death: Sepsis, Multiorgan Failure   Samreet Edenfield FIELDING 09/22/2017, 12:36 AM  [facility] [place] [facility]    OFFICE [phone]  Total clinical and documentation time for today: <30 minutes  Note:  This document was prepared using Dragon voice recognition software and may include unintentional dictation errors.

## 2017-10-19 NOTE — Progress Notes (Signed)
Funeral home arrangements called back by family-pt to be held for potential tissue donation. Nursing Supervisor aware.

## 2017-10-19 NOTE — Progress Notes (Signed)
Notified nephew Festus BarrenKevin Simmons-he will notify Paris Lorenn Simmons and call me back with arrangements.

## 2017-10-19 NOTE — Discharge Summary (Signed)
Please note Dr Anne HahnWillis has already done death note on this patient.

## 2017-10-19 NOTE — Progress Notes (Signed)
Pt without respirations or heartbeat 

## 2017-10-19 DEATH — deceased

## 2018-12-17 IMAGING — CR DG CHEST 2V
1 series · 2 of 2 positions shown · non-contrast
Comparison: 08/30/2007

CLINICAL DATA: States had been SOB x 2 months. States began
coughing up "strawberry red" phlegm approx 1 month ago. States has
lost about 40 pounds in past month and half.

EXAM:
CHEST  2 VIEW

[Series 1: dg chest 2 view · 0.14mm/px · 2 of 2 slices shown]
[im 1/2]
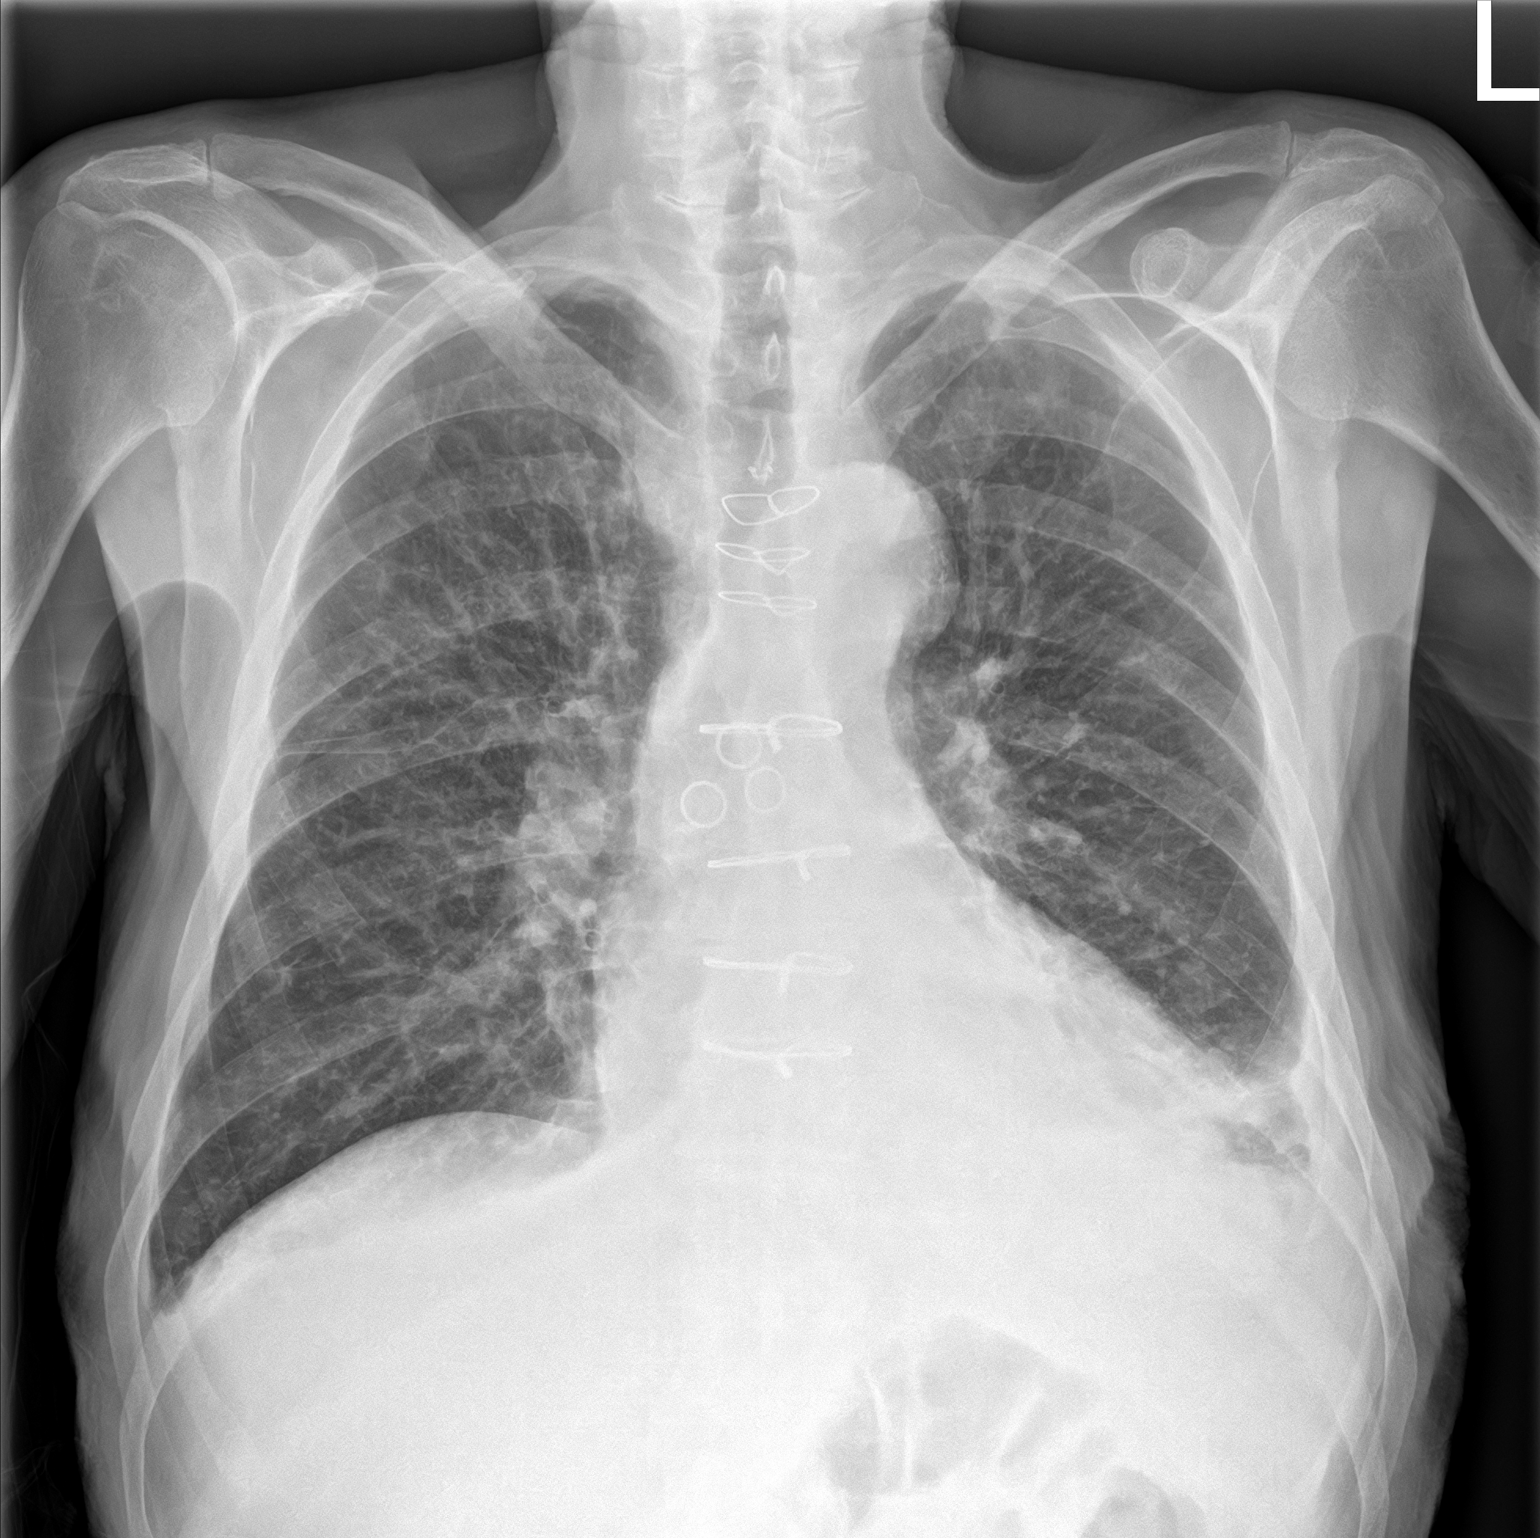
[im 2/2]
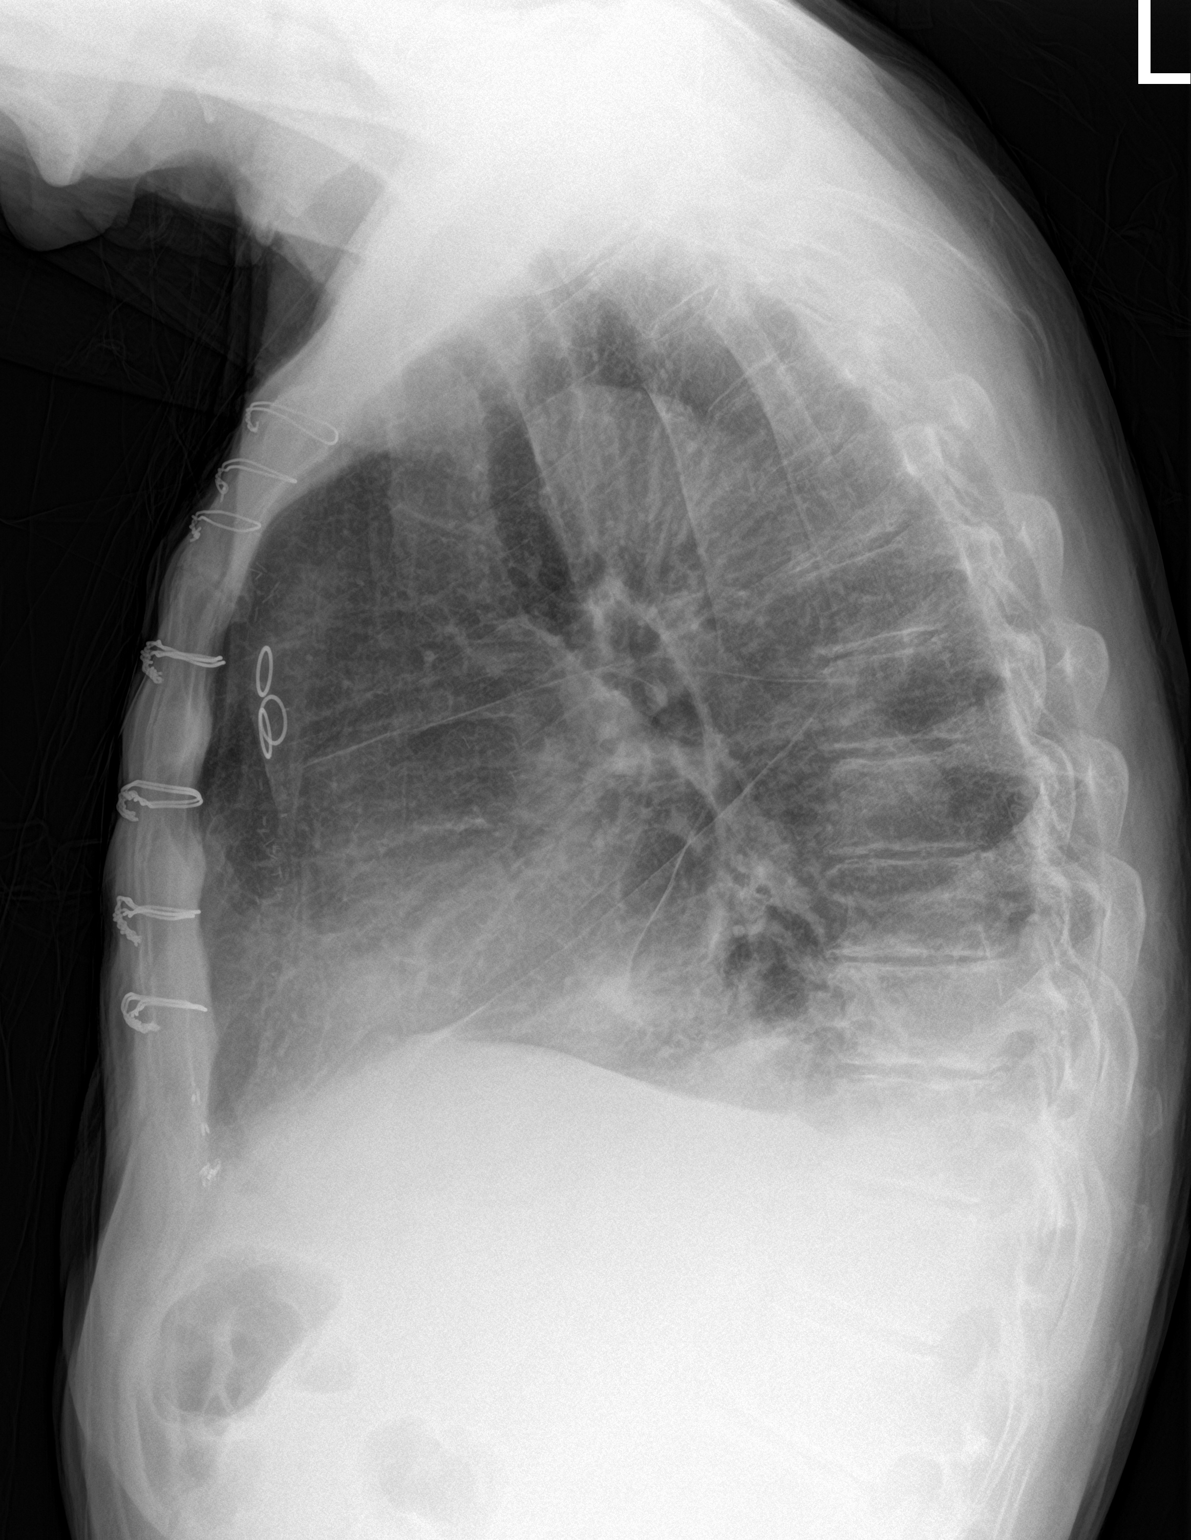

[2 of 2 positions shown; findings below may reference images not displayed]

FINDINGS: There is patchy airspace opacity at the left lung base mostly
silhouetting the left hemidiaphragm, new since the prior exam.

Lungs show prominent bronchovascular markings but are otherwise
clear. Small pleural effusions. No pneumothorax.

There stable changes from prior CABG surgery. No mediastinal or
hilar masses or evidence of adenopathy.

Skeletal structures are intact.
IMPRESSION: 1. Left lung base opacity consistent with pneumonia.
2. Small pleural effusions.  No evidence of pulmonary edema.

## 2018-12-23 IMAGING — DX DG CHEST 1V PORT
1 series · 1 of 1 positions shown · non-contrast
Comparison: Chest abdomen and pelvis CTA 2569 hours today and
earlier.

CLINICAL DATA: 76-year-old male with chest pain and tachycardia.

EXAM:
PORTABLE CHEST 1 VIEW

[chest ap]
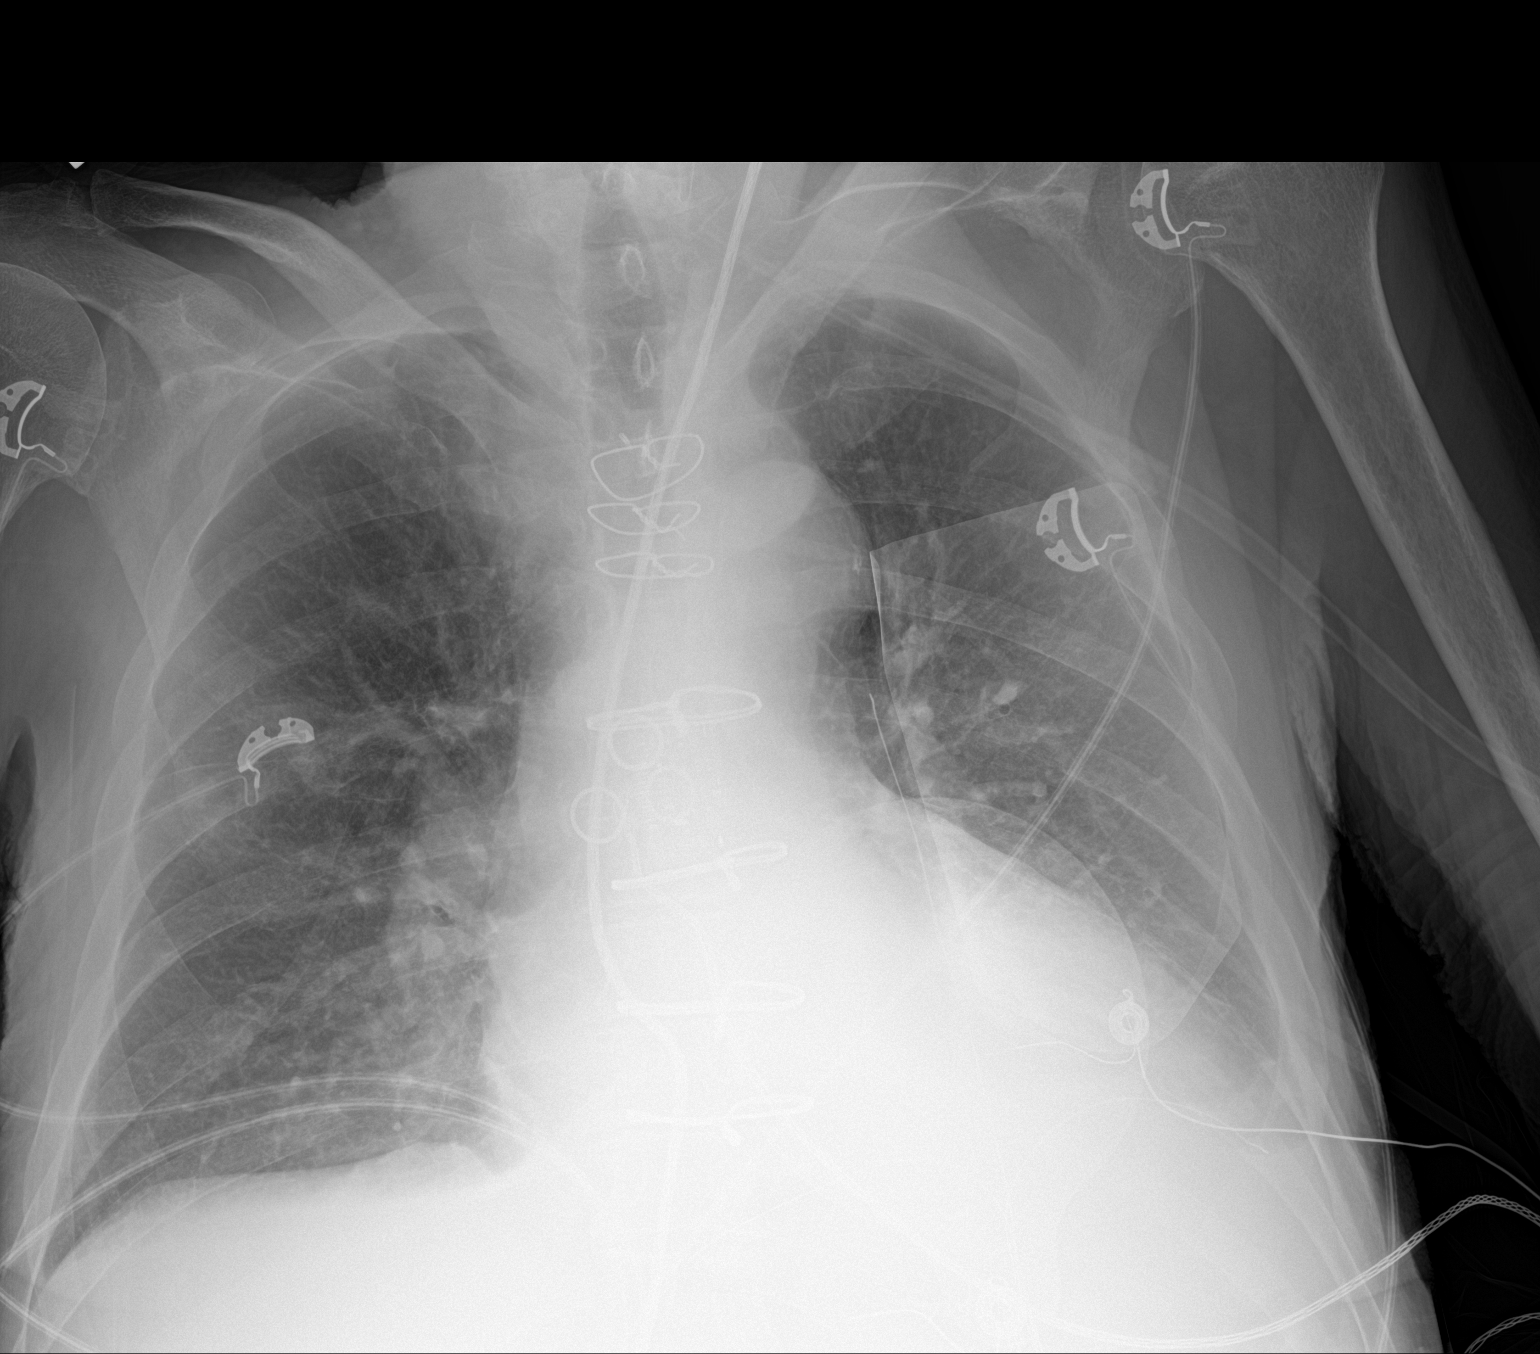

[1 of 1 positions shown; findings below may reference images not displayed]

FINDINGS: Portable AP upright view at 8544 hours. Sequelae of CABG. Stable
cardiac size and mediastinal contours. Celsius CTA findings
regarding abnormal thoracic aorta. Visualized tracheal air column is
within normal limits. Small to moderate bilateral layering pleural
effusions greater on the left. No pneumothorax, pulmonary edema, or
other confluent pulmonary opacity.
IMPRESSION: 1. Moderate bilateral pleural effusions, better demonstrated on CTA
today.
2. Thoracic and abdominal aortic aneurysms, see also CTA report.

## 2018-12-24 IMAGING — DX DG CHEST 1V PORT
1 series · 1 of 1 positions shown · non-contrast
Comparison: 09/15/2017

CLINICAL DATA: Respiratory failure

EXAM:
PORTABLE CHEST 1 VIEW

[chest ap]
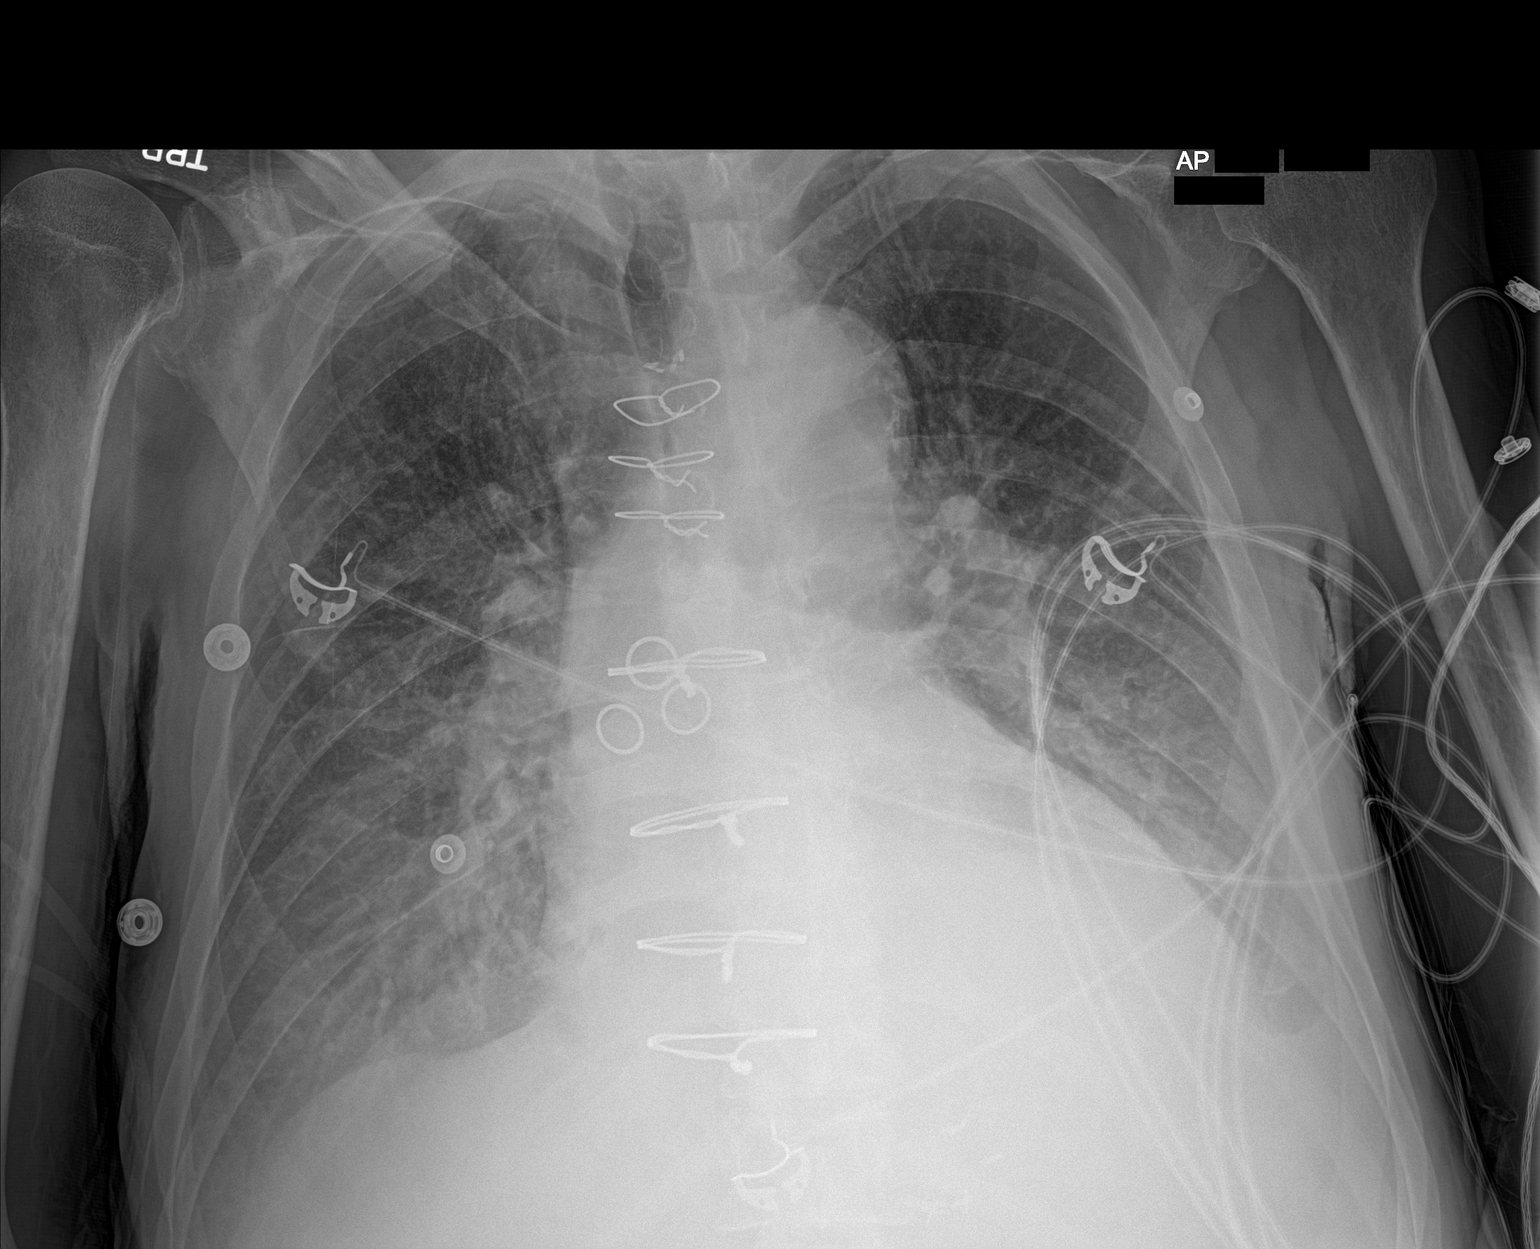

[1 of 1 positions shown; findings below may reference images not displayed]

FINDINGS: Progression of vascular congestion and mild edema. Progression of
bibasilar atelectasis and small pleural effusions. Prior CABG.
IMPRESSION: Progressive heart failure. Progression of pulmonary edema and
progression of bibasilar atelectasis/effusion.
